# Patient Record
Sex: Female | Born: 2000 | Race: Black or African American | Hispanic: No | Marital: Single | Smoking: Never smoker
Health system: Southern US, Community
[De-identification: ages and names within clinical notes are randomized; demographics above are authoritative.]

## PROBLEM LIST (undated history)

## (undated) DIAGNOSIS — T1490XA Injury, unspecified, initial encounter: Secondary | ICD-10-CM

## (undated) DIAGNOSIS — J45909 Unspecified asthma, uncomplicated: Secondary | ICD-10-CM

## (undated) HISTORY — DX: Injury, unspecified, initial encounter: T14.90XA

---
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
## 2020-01-26 ENCOUNTER — Other Ambulatory Visit: Payer: Medicaid Other

## 2020-03-12 ENCOUNTER — Ambulatory Visit: Payer: Medicaid Other | Admitting: Dermatology

## 2020-03-21 ENCOUNTER — Ambulatory Visit (INDEPENDENT_AMBULATORY_CARE_PROVIDER_SITE_OTHER): Payer: Medicaid Other | Admitting: Pediatrics

## 2020-03-21 ENCOUNTER — Other Ambulatory Visit: Payer: Self-pay

## 2020-03-21 DIAGNOSIS — Z3042 Encounter for surveillance of injectable contraceptive: Secondary | ICD-10-CM | POA: Diagnosis not present

## 2020-03-21 LAB — POCT URINE PREGNANCY: Preg Test, Ur: NEGATIVE

## 2020-03-21 MED ORDER — MEDROXYPROGESTERONE ACETATE 150 MG/ML IM SUSP
150.0000 mg | Freq: Once | INTRAMUSCULAR | Status: AC
  Administered 2020-03-21: 150 mg via INTRAMUSCULAR

## 2020-05-06 IMAGING — DX DG ANKLE COMPLETE 3+V*R*
3 series · 3 of 3 positions shown · non-contrast
Comparison: None.

CLINICAL DATA: Recent fall with pain

EXAM:
RIGHT ANKLE - COMPLETE 3+ VIEW

[ankle ap]
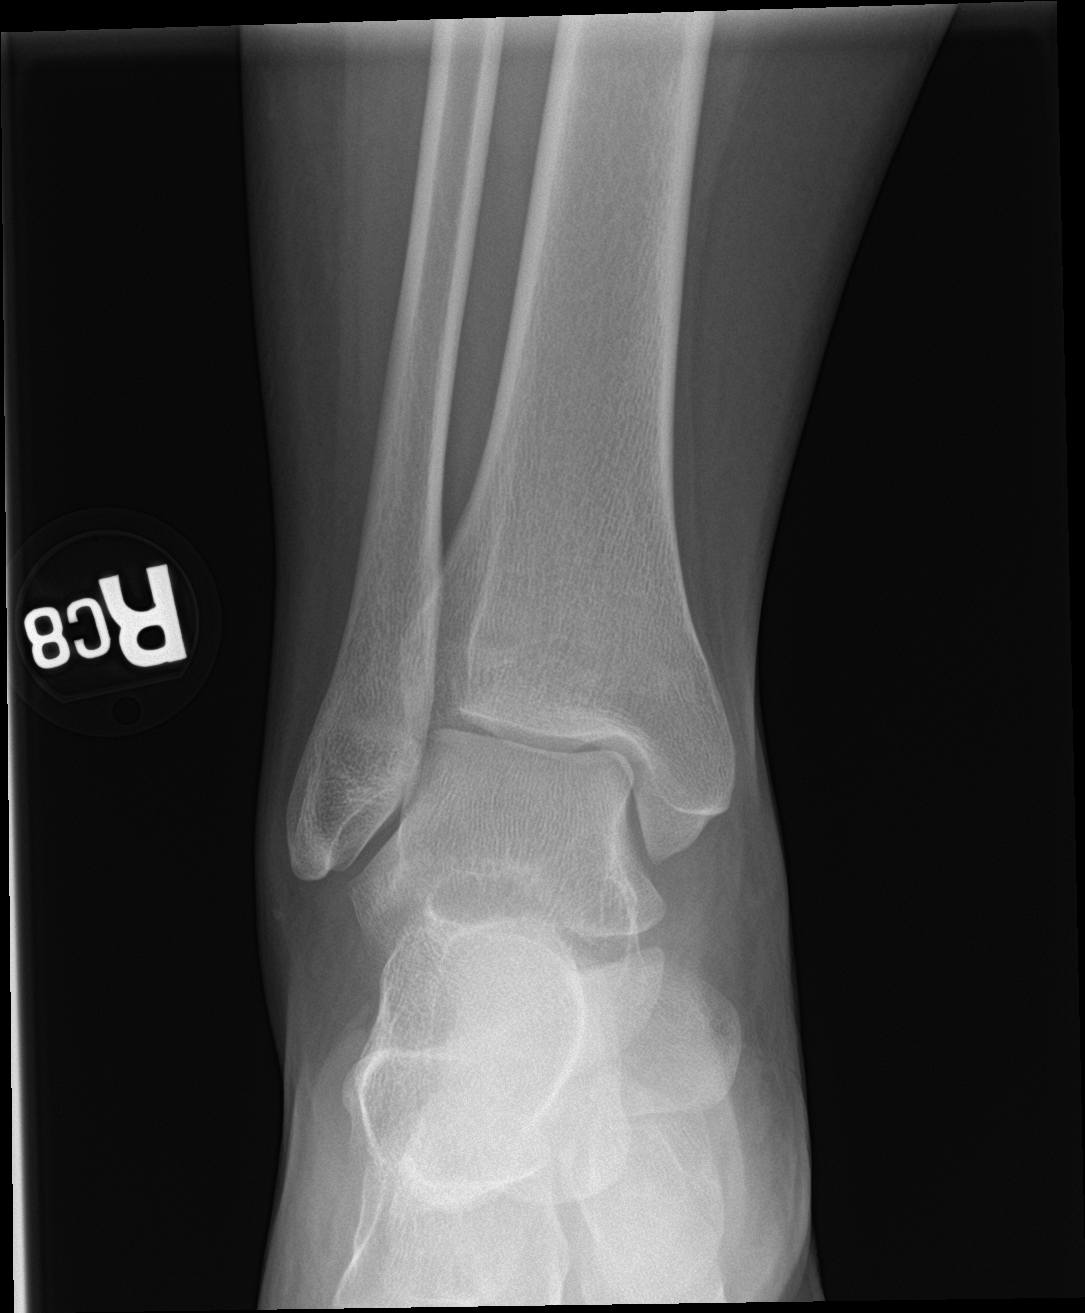

[ankle obl]
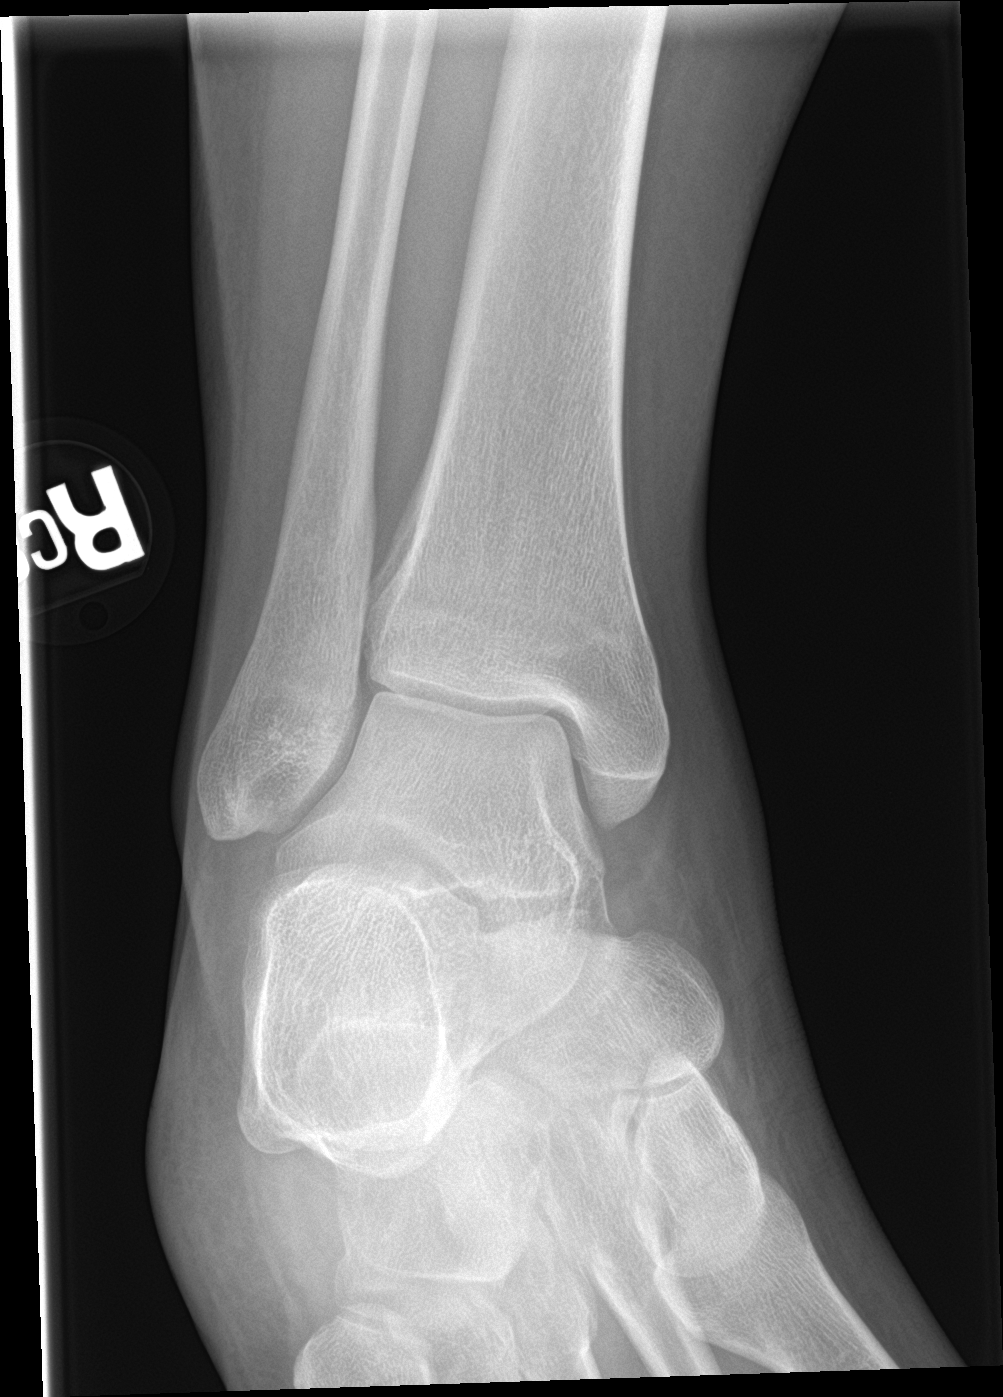

[ankle lat]
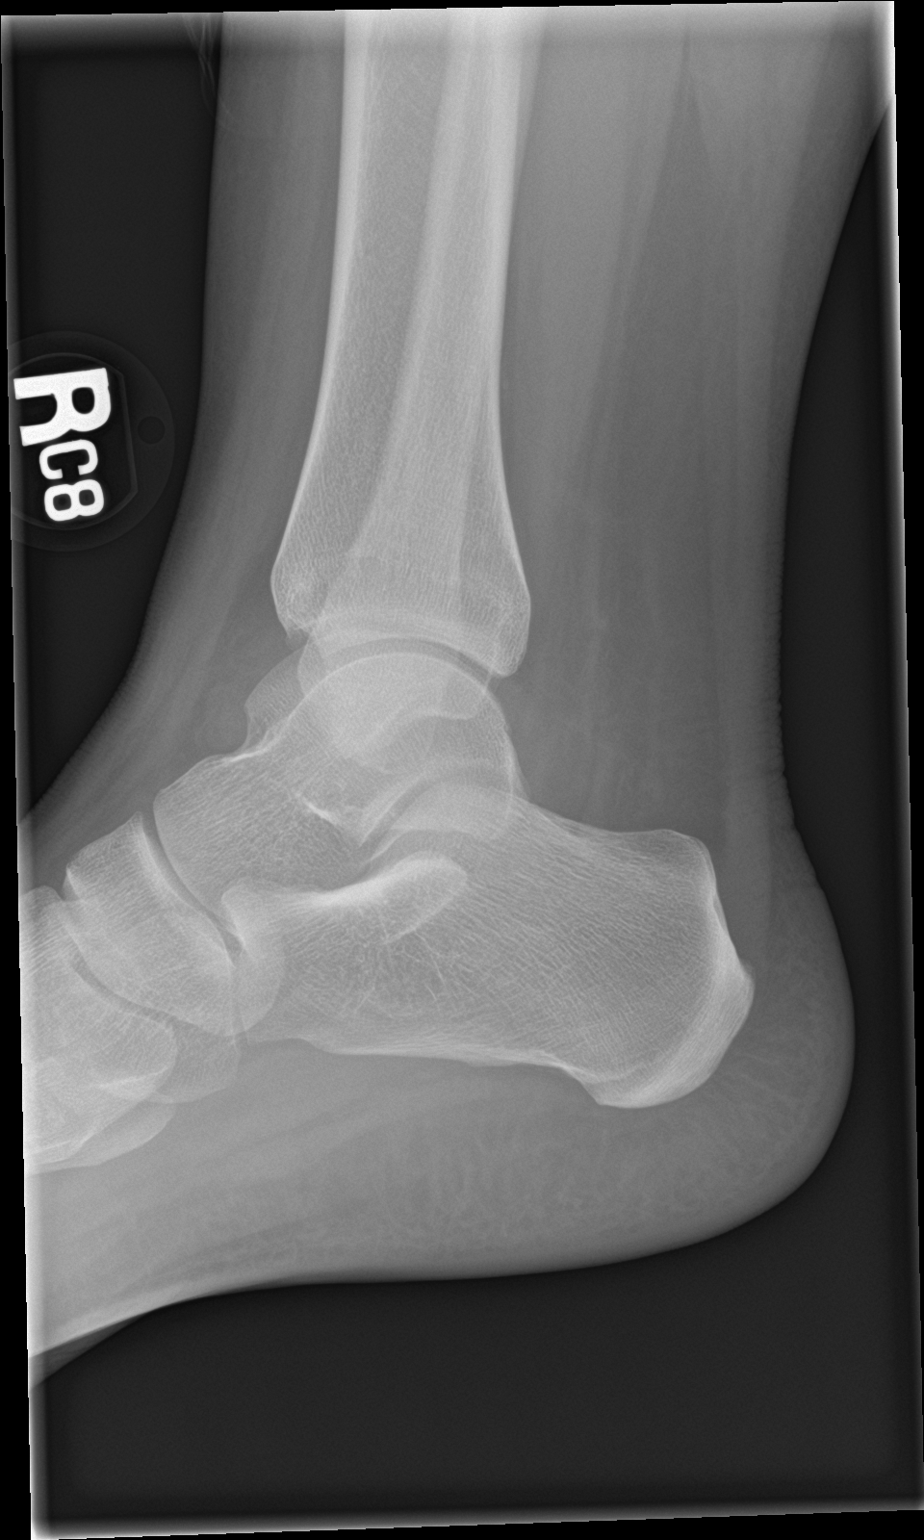

[3 of 3 positions shown; findings below may reference images not displayed]

FINDINGS: Frontal, oblique, and lateral views were obtained. There is mild
soft tissue swelling with small joint effusion. There is no
appreciable fracture. There is no joint space narrowing or erosion.
The ankle mortise appears intact.
IMPRESSION: Mild soft tissue swelling with small joint effusion. Question
underlying ligamentous injury. No fracture evident. No appreciable
arthropathy. Ankle mortis appears intact.

## 2020-05-08 ENCOUNTER — Ambulatory Visit: Payer: Medicaid Other

## 2020-05-08 ENCOUNTER — Encounter: Payer: Self-pay | Admitting: Pediatrics

## 2020-06-18 ENCOUNTER — Other Ambulatory Visit: Payer: Self-pay

## 2020-06-18 ENCOUNTER — Ambulatory Visit (INDEPENDENT_AMBULATORY_CARE_PROVIDER_SITE_OTHER): Payer: Medicaid Other | Admitting: Pediatrics

## 2020-06-18 DIAGNOSIS — Z308 Encounter for other contraceptive management: Secondary | ICD-10-CM

## 2020-06-18 LAB — POCT URINE PREGNANCY: Preg Test, Ur: NEGATIVE

## 2020-06-18 MED ORDER — MEDROXYPROGESTERONE ACETATE 150 MG/ML IM SUSP
150.0000 mg | Freq: Once | INTRAMUSCULAR | Status: AC
  Administered 2020-06-18: 150 mg via INTRAMUSCULAR

## 2020-06-18 NOTE — Progress Notes (Signed)
Pt seen today for birth control administration. Patient is well appearing and no complaints of previous reaction.   Urine HCG completed and negative. Pt given Depo shot in Right Deltoid.   No further needs at this time

## 2020-08-06 ENCOUNTER — Encounter: Payer: Self-pay | Admitting: Pediatrics

## 2020-09-10 ENCOUNTER — Encounter: Payer: Medicaid Other | Admitting: Women's Health

## 2020-09-26 ENCOUNTER — Other Ambulatory Visit: Payer: Self-pay

## 2020-09-26 ENCOUNTER — Other Ambulatory Visit (HOSPITAL_COMMUNITY)
Admission: RE | Admit: 2020-09-26 | Discharge: 2020-09-26 | Disposition: A | Discharge status: Home / Self Care | Payer: Medicaid Other | Source: Ambulatory Visit | Attending: Adult Health | Admitting: Adult Health

## 2020-09-26 ENCOUNTER — Ambulatory Visit (INDEPENDENT_AMBULATORY_CARE_PROVIDER_SITE_OTHER): Payer: Medicaid Other | Admitting: Adult Health

## 2020-09-26 ENCOUNTER — Encounter: Payer: Self-pay | Admitting: Adult Health

## 2020-09-26 VITALS — BP 110/76 | HR 91 | Ht 61.5 in | Wt 207.5 lb

## 2020-09-26 DIAGNOSIS — Z30013 Encounter for initial prescription of injectable contraceptive: Secondary | ICD-10-CM

## 2020-09-26 DIAGNOSIS — Z113 Encounter for screening for infections with a predominantly sexual mode of transmission: Secondary | ICD-10-CM | POA: Diagnosis not present

## 2020-09-26 DIAGNOSIS — N898 Other specified noninflammatory disorders of vagina: Secondary | ICD-10-CM | POA: Insufficient documentation

## 2020-09-26 DIAGNOSIS — Z3202 Encounter for pregnancy test, result negative: Secondary | ICD-10-CM

## 2020-09-26 LAB — POCT URINE PREGNANCY: Preg Test, Ur: NEGATIVE

## 2020-09-26 MED ORDER — MEDROXYPROGESTERONE ACETATE 150 MG/ML IM SUSY: PREFILLED_SYRINGE | INTRAMUSCULAR | 4 refills | Status: DC

## 2020-09-26 MED ORDER — MEDROXYPROGESTERONE ACETATE 150 MG/ML IM SUSP
150.0000 mg | Freq: Once | INTRAMUSCULAR | Status: AC
  Administered 2020-09-26: 150 mg via INTRAMUSCULAR
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
## 2020-09-27 LAB — RPR: RPR Ser Ql: NONREACTIVE

## 2020-09-27 LAB — HEPATITIS C ANTIBODY: Hep C Virus Ab: <0.1 s/co ratio (ref 0.0–0.9)

## 2020-09-27 LAB — HIV ANTIBODY (ROUTINE TESTING W REFLEX): HIV Screen 4th Generation wRfx: NONREACTIVE

## 2020-09-28 LAB — CERVICOVAGINAL ANCILLARY ONLY
Bacterial Vaginitis (gardnerella): NEGATIVE
Candida Glabrata: NEGATIVE
Candida Vaginitis: NEGATIVE
Chlamydia: NEGATIVE
Comment: NEGATIVE
Comment: NEGATIVE
Comment: NEGATIVE
Comment: NEGATIVE
Comment: NEGATIVE
Comment: NORMAL
Neisseria Gonorrhea: NEGATIVE
Trichomonas: NEGATIVE

## 2020-10-16 ENCOUNTER — Emergency Department (HOSPITAL_COMMUNITY)
Admission: EM | Admit: 2020-10-16 | Discharge: 2020-10-16 | Disposition: A | Discharge status: Home / Self Care | Payer: Medicaid Other | Attending: Emergency Medicine | Admitting: Emergency Medicine

## 2020-10-16 ENCOUNTER — Other Ambulatory Visit: Payer: Self-pay

## 2020-10-16 ENCOUNTER — Encounter (HOSPITAL_COMMUNITY): Payer: Self-pay | Admitting: *Deleted

## 2020-10-16 DIAGNOSIS — R112 Nausea with vomiting, unspecified: Secondary | ICD-10-CM | POA: Diagnosis not present

## 2020-10-16 DIAGNOSIS — R110 Nausea: Secondary | ICD-10-CM

## 2020-10-16 DIAGNOSIS — J4530 Mild persistent asthma, uncomplicated: Secondary | ICD-10-CM | POA: Diagnosis not present

## 2020-10-16 DIAGNOSIS — Z7951 Long term (current) use of inhaled steroids: Secondary | ICD-10-CM | POA: Diagnosis not present

## 2020-10-16 DIAGNOSIS — R519 Headache, unspecified: Secondary | ICD-10-CM | POA: Diagnosis not present

## 2020-10-16 LAB — CBC
HCT: 40.6 % (ref 36.0–46.0)
Hemoglobin: 13.5 g/dL (ref 12.0–15.0)
MCH: 29.4 pg (ref 26.0–34.0)
MCHC: 33.3 g/dL (ref 30.0–36.0)
MCV: 88.5 fL (ref 80.0–100.0)
Platelets: 356 10*3/uL (ref 150–400)
RBC: 4.59 MIL/uL (ref 3.87–5.11)
RDW: 12.4 % (ref 11.5–15.5)
WBC: 8.3 10*3/uL (ref 4.0–10.5)
nRBC: 0 % (ref 0.0–0.2)

## 2020-10-16 LAB — COMPREHENSIVE METABOLIC PANEL
ALT: 14 U/L (ref 0–44)
AST: 21 U/L (ref 15–41)
Albumin: 4.3 g/dL (ref 3.5–5.0)
Alkaline Phosphatase: 103 U/L (ref 38–126)
Anion gap: 6 (ref 5–15)
BUN: 8 mg/dL (ref 6–20)
CO2: 22 mmol/L (ref 22–32)
Calcium: 8.8 mg/dL — ABNORMAL LOW (ref 8.9–10.3)
Chloride: 108 mmol/L (ref 98–111)
Creatinine, Ser: 0.62 mg/dL (ref 0.44–1.00)
GFR, Estimated: >60 mL/min (ref >60)
Glucose, Bld: 92 mg/dL (ref 70–99)
Potassium: 3.8 mmol/L (ref 3.5–5.1)
Sodium: 136 mmol/L (ref 135–145)
Total Bilirubin: 0.6 mg/dL (ref 0.3–1.2)
Total Protein: 7.9 g/dL (ref 6.5–8.1)

## 2020-10-16 LAB — LIPASE, BLOOD: Lipase: 24 U/L (ref 11–51)

## 2020-10-16 MED ORDER — ONDANSETRON 4 MG PO TBDP
4.0000 mg | ORAL_TABLET | Freq: Once | ORAL | Status: AC
  Administered 2020-10-16: 4 mg via ORAL

## 2020-10-16 NOTE — ED Provider Notes (Signed)
[facility] EMERGENCY DEPARTMENT Provider Note   CSN: [id number] Arrival date & time: 10/16/20  1542     History Chief Complaint  Patient presents with   Nausea    Allison Key is a 20 y.o. female.  HPI Patient presents with headache and nausea.  Began earlier today but is reportedly having headaches at work.  States she would have headaches before she started a new job but has become worse now.  States that she had a headache and then vomited.  States that the vomiting is still there but the headache is improved.  Denies possibility pregnancy.  Denies abdominal pain.  Denies dysuria.  States the light did bother her a little bit previously but now resolved.  States that the headache was dull and behind both of her eyes.    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 No pertinent surgical history.   OB History     Gravida  0   Para  0   Term  0   Preterm  0   AB  0   Living  0      SAB  0   IAB  0   Ectopic  0   Multiple  0   Live Births  0           Family History  Problem Relation Age of Onset   Diabetes Paternal Grandfather    Alzheimer's disease Paternal Grandmother    Hypertension Maternal Grandmother    Learning disabilities Maternal Grandmother     Kidney disease Maternal Grandmother    Kidney failure Maternal Grandmother    Hypertension Father    Allergies Mother    Hypertension Mother    Healthy Brother    Healthy Brother    Bipolar disorder Sister    Diabetes Other    Hypertension Other     Social History   Tobacco Use   Smoking status: Never    Passive exposure: Yes   Smokeless tobacco: Never  Vaping Use   Vaping Use: Former  Substance Use Topics   Alcohol use: No   Drug use: Yes    Types: Marijuana    Comment: daily    Home Medications Prior to Admission medications   Medication Sig Start Date End Date Taking? Authorizing Provider  acetaminophen (TYLENOL) 500 MG tablet Take 1,000 mg by mouth every 6 (six) hours as needed.   Yes [provider]  clindamycin-benzoyl peroxide (BENZACLIN WITH PUMP) gel Dispense generic brand. Apply to acne on face twice a day after washing with acne soap 03/06/17  Yes Rosiland Oz, MD  DIFFERIN 0.1 % cream Dispense Brand Name. Apply to acne at night after washing face with acne soap 07/21/16  Yes Rosiland Oz, MD  fluticasone (FLOVENT HFA) 110 MCG/ACT inhaler One puff twice  a day for asthma 10/26/19  Yes Rosiland Oz, MD  medroxyPROGESTERone Acetate 150 MG/ML SUSY 150 mg For office injection every 12 weeks 09/26/20  Yes Adline Potter, NP  PROAIR HFA 108 [phone] Base) MCG/ACT inhaler Two puffs every 4 to 6 hours as needed for wheezing or coughing 10/26/19  Yes Rosiland Oz, MD  fluticasone (FLONASE) 50 MCG/ACT nasal spray INHALE 2 SPRAYS IN EACH NOSTRIL ONCE DAILY. Patient not taking: No sig reported 11/20/16   Rosiland Oz, MD    Allergies    Patient has no known allergies.  Review of Systems   Review of Systems  Constitutional:  Positive for appetite change.  HENT:  Negative for congestion.   Eyes:  Positive for photophobia.  Respiratory:  Negative for cough.   Cardiovascular:  Negative for chest pain.  Gastrointestinal:  Positive for  nausea and vomiting.  Genitourinary:  Negative for flank pain.  Musculoskeletal:  Negative for back pain.  Skin:  Negative for rash.  Neurological:  Positive for headaches.  Psychiatric/Behavioral:  Negative for confusion.    Physical Exam Updated Vital Signs BP (!) 122/92   Pulse 80   Temp 98 F (36.7 C)   Resp 14   Ht 5\' 1"  (1.549 m)   Wt 91.5 kg   SpO2 100%   BMI 38.11 kg/m   Physical Exam Vitals and nursing note reviewed.  Constitutional:      Appearance: Normal appearance.  HENT:     Head: Atraumatic.  Eyes:     Extraocular Movements: Extraocular movements intact.     Pupils: Pupils are equal, round, and reactive to light.  Cardiovascular:     Rate and Rhythm: Regular rhythm.  Pulmonary:     Breath sounds: No wheezing or rhonchi.  Abdominal:     Tenderness: There is no abdominal tenderness.  Musculoskeletal:        General: No tenderness.     Cervical back: Neck supple.  Skin:    General: Skin is warm.     Capillary Refill: Capillary refill takes less than 2 seconds.  Neurological:     Mental Status: She is alert and oriented to person, place, and time.  Psychiatric:        Mood and Affect: Mood normal.    ED Results / Procedures / Treatments   Labs (all labs ordered are listed, but only abnormal results are displayed) Labs Reviewed  COMPREHENSIVE METABOLIC PANEL - Abnormal; Notable for the following components:      Result Value   Calcium 8.8 (*)    All other components within normal limits  LIPASE, BLOOD  CBC  URINALYSIS, ROUTINE W REFLEX MICROSCOPIC  POC URINE PREG, ED    EKG None  Radiology No results found.  Procedures Procedures   Medications Ordered in ED Medications  ondansetron (ZOFRAN-ODT) disintegrating tablet 4 mg (4 mg Oral Given 10/16/20 2101)    ED Course  I have reviewed the triage vital signs and the nursing notes.  Pertinent labs & imaging results that were available during my care of the patient were reviewed by me  and considered in my medical decision making (see chart for details).    MDM Rules/Calculators/A&P                           Patient with headache and nausea.  Headache is resolved.  Nonfocal exam now except for some nausea.  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  Has had headaches before this but does wear safety glasses now which she did not do previously.  Lab work reassuring.  Tolerated orals.  Requesting work note.  Given antiemetics.  Doubt severe intracranial pathology at this time.  Doubt pregnancy.  Doubt intra-abdominal pathology.  Discharge home. Final Clinical Impression(s) / ED Diagnoses Final diagnoses:  Nausea  Acute nonintractable headache, unspecified headache type    Rx / DC Orders ED Discharge Orders     None        Benjiman Core, MD 10/16/20 2336

## 2020-10-16 NOTE — ED Notes (Addendum)
Nausea relieved, requests 2 day work note. Pt able to tolerate ice water without nausea or vomiting. Dr. Rubin Payor notified.

## 2020-10-16 NOTE — ED Notes (Signed)
Pt given ice water as fluid challenge, will reevaluate in 15 minutes.

## 2020-10-16 NOTE — ED Triage Notes (Signed)
C/o headache with nausea

## 2020-10-17 ENCOUNTER — Telehealth: Payer: Self-pay

## 2020-10-17 NOTE — Telephone Encounter (Signed)
Transition Care Management Unsuccessful Follow-up Telephone Call  Date of discharge and from where:  10/16/2020-[facility]   Attempts:  1st Attempt  Reason for unsuccessful TCM follow-up call:  Left voice message

## 2020-10-22 NOTE — Telephone Encounter (Signed)
Transition Care Management Unsuccessful Follow-up Telephone Call  Date of discharge and from where:  10/16/2020 from [facility]  Attempts:  2nd Attempt  Reason for unsuccessful TCM follow-up call:  Left voice message

## 2020-10-24 NOTE — Telephone Encounter (Signed)
Transition Care Management Unsuccessful Follow-up Telephone Call  Date of discharge and from where:  10/16/2020-[facility]  Attempts:  3rd Attempt  Reason for unsuccessful TCM follow-up call:  Left voice message

## 2020-10-30 ENCOUNTER — Ambulatory Visit: Payer: Self-pay | Admitting: Pediatrics

## 2020-11-16 ENCOUNTER — Other Ambulatory Visit: Payer: Self-pay | Admitting: Pediatrics

## 2020-11-16 DIAGNOSIS — L700 Acne vulgaris: Secondary | ICD-10-CM

## 2020-11-19 NOTE — Telephone Encounter (Signed)
Needs a refill

## 2020-11-19 NOTE — Telephone Encounter (Signed)
Ok thank you 

## 2020-12-18 ENCOUNTER — Other Ambulatory Visit: Payer: Self-pay

## 2020-12-18 ENCOUNTER — Ambulatory Visit (INDEPENDENT_AMBULATORY_CARE_PROVIDER_SITE_OTHER): Payer: Medicaid Other

## 2020-12-18 DIAGNOSIS — Z3042 Encounter for surveillance of injectable contraceptive: Secondary | ICD-10-CM

## 2020-12-18 MED ORDER — MEDROXYPROGESTERONE ACETATE 150 MG/ML IM SUSY: PREFILLED_SYRINGE | Freq: Once | INTRAMUSCULAR | Status: AC

## 2020-12-18 NOTE — Progress Notes (Signed)
   NURSE VISIT- INJECTION  SUBJECTIVE:  Allison Key is a 20 y.o. G0P0000 female here for a Depo Provera for contraception/period management. She is a GYN patient.   OBJECTIVE:  There were no vitals taken for this visit.  Appears well, in no apparent distress  Injection administered in: Left deltoid  Meds ordered this encounter  Medications   medroxyPROGESTERone Acetate SUSY    ASSESSMENT: GYN patient Depo Provera for contraception/period management PLAN: Follow-up: in 11-13 weeks for next Depo   Allison Key A Allison Key  12/18/2020 3:44 PM

## 2020-12-19 ENCOUNTER — Ambulatory Visit (INDEPENDENT_AMBULATORY_CARE_PROVIDER_SITE_OTHER): Payer: Medicaid Other | Admitting: Adult Health

## 2020-12-19 ENCOUNTER — Encounter: Payer: Self-pay | Admitting: Adult Health

## 2020-12-19 VITALS — BP 126/86 | HR 97 | Ht 61.0 in | Wt 205.8 lb

## 2020-12-19 DIAGNOSIS — L732 Hidradenitis suppurativa: Secondary | ICD-10-CM | POA: Diagnosis not present

## 2020-12-19 MED ORDER — DOXYCYCLINE HYCLATE 100 MG PO TABS: 100.0000 mg | ORAL_TABLET | Freq: Two times a day (BID) | ORAL | 1 refills | Status: DC

## 2020-12-19 MED ORDER — CLINDAMYCIN PHOSPHATE 1 % EX GEL: Freq: Two times a day (BID) | CUTANEOUS | 2 refills | Status: DC

## 2020-12-19 NOTE — Progress Notes (Signed)
  Subjective:     Patient ID: Allison Key, female   DOB: [birth year], 20 y.o.   MRN: [id number]  HPI Allison Key is a 20 year old black female,single, G0P0, in complaining of boil left under arm, and scarring.   Review of Systems Has ?boil left arm and scarring Reviewed past medical,surgical, social and family history. Reviewed medications and allergies.     Objective:   Physical Exam BP 126/86 (BP Location: Right Arm, Patient Position: Sitting, Cuff Size: Normal)   Pulse 97   Ht 5\' 1"  (1.549 m)   Wt 205 lb 12.8 oz (93.4 kg)   BMI 38.89 kg/m     Skin warm and dry. Neck: mid line trachea, normal thyroid, good ROM, no lymphadenopathy noted. Lungs: clear to ausculation bilaterally. Cardiovascular: regular rate and rhythm. Has 1 cm thickened area left under arm and scarring. Co exam with NP student.  Fall risk is low Depression screen [facility] 2/9 12/19/2020 09/26/2020 10/26/2019  Decreased Interest 0 0 2  Down, Depressed, Hopeless 0 0 2  PHQ - 2 Score 0 0 4  Altered sleeping - 0 2  Tired, decreased energy - 3 1  Change in appetite - 2 0  Feeling bad or failure about yourself  - 0 0  Trouble concentrating - 0 1  Moving slowly or fidgety/restless - 0 1  Suicidal thoughts - 0 -  PHQ-9 Score - 5 9     Upstream - 12/19/20 0850       Pregnancy Intention Screening   Does the patient want to become pregnant in the next year? No    Does the patient's partner want to become pregnant in the next year? No    Would the patient like to discuss contraceptive options today? No      Contraception Wrap Up   Current Method Hormonal Injection    End Method Hormonal Injection    Contraception Counseling Provided No             Assessment:     1. Hidradenitis suppurativa of left axilla Will rx doxycycline and cleocin T gel Meds ordered this encounter  Medications   doxycycline (VIBRA-TABS) 100 MG tablet    Sig: Take 1 tablet (100 mg total) by mouth 2 (two) times daily.     Dispense:  20 tablet    Refill:  1    Order Specific Question:   Supervising Provider    Answer:   12/21/20 H [[id number]]   clindamycin (CLEOCIN-T) 1 % gel    Sig: Apply topically 2 (two) times daily.    Dispense:  30 g    Refill:  2    Order Specific Question:   Supervising Provider    Answer:   Duane Lope H [[id number]]   Stop solid deodorant and use clear gel Do not shave If persists will add metformin Review handout on HS    Plan:     Follow up prn

## 2021-02-26 ENCOUNTER — Other Ambulatory Visit: Payer: Medicaid Other

## 2021-02-27 ENCOUNTER — Other Ambulatory Visit: Payer: Self-pay

## 2021-02-27 ENCOUNTER — Telehealth: Payer: Self-pay | Admitting: *Deleted

## 2021-02-27 ENCOUNTER — Other Ambulatory Visit (INDEPENDENT_AMBULATORY_CARE_PROVIDER_SITE_OTHER): Payer: Medicaid Other | Admitting: *Deleted

## 2021-02-27 ENCOUNTER — Other Ambulatory Visit (HOSPITAL_COMMUNITY)
Admission: RE | Admit: 2021-02-27 | Discharge: 2021-02-27 | Disposition: A | Discharge status: Home / Self Care | Payer: Medicaid Other | Source: Ambulatory Visit | Attending: Obstetrics & Gynecology | Admitting: Obstetrics & Gynecology

## 2021-02-27 DIAGNOSIS — Z113 Encounter for screening for infections with a predominantly sexual mode of transmission: Secondary | ICD-10-CM | POA: Insufficient documentation

## 2021-02-27 MED ORDER — DOXYCYCLINE HYCLATE 100 MG PO TABS: 100.0000 mg | ORAL_TABLET | Freq: Two times a day (BID) | ORAL | 1 refills | Status: DC

## 2021-02-27 MED ORDER — CLINDAMYCIN PHOSPHATE 1 % EX GEL: Freq: Two times a day (BID) | CUTANEOUS | 2 refills | Status: DC

## 2021-02-27 NOTE — Telephone Encounter (Signed)
Pt never picked up her doxycycline or clindamycin. The rx expired. Can you send it in again to [facility]?

## 2021-02-27 NOTE — Telephone Encounter (Signed)
Sent refill in for doxy and cleocin gel

## 2021-02-27 NOTE — Progress Notes (Signed)
° °  NURSE VISIT- VAGINITIS/STD/POC  SUBJECTIVE:  Allison Key is a 21 y.o. G0P0000 GYN patientfemale here for a vaginal swab for STD screen.  She reports the following symptoms: none for 0 days. Denies abnormal vaginal bleeding, significant pelvic pain, fever, or UTI symptoms.  OBJECTIVE:  There were no vitals taken for this visit.  Appears well, in no apparent distress  ASSESSMENT: Vaginal swab for STD screen  PLAN: Self-collected vaginal probe for Gonorrhea, Chlamydia, Trichomonas, Bacterial Vaginosis, Yeast sent to lab Treatment: to be determined once results are received Follow-up as needed if symptoms persist/worsen, or new symptoms develop  [name]  02/27/2021 10:08 AM

## 2021-02-28 LAB — CERVICOVAGINAL ANCILLARY ONLY
Bacterial Vaginitis (gardnerella): NEGATIVE
Candida Glabrata: NEGATIVE
Candida Vaginitis: NEGATIVE
Chlamydia: NEGATIVE
Comment: NEGATIVE
Comment: NEGATIVE
Comment: NEGATIVE
Comment: NEGATIVE
Comment: NEGATIVE
Comment: NORMAL
Neisseria Gonorrhea: NEGATIVE
Trichomonas: NEGATIVE

## 2021-03-08 ENCOUNTER — Ambulatory Visit (INDEPENDENT_AMBULATORY_CARE_PROVIDER_SITE_OTHER): Payer: Medicaid Other | Admitting: *Deleted

## 2021-03-08 ENCOUNTER — Other Ambulatory Visit: Payer: Self-pay

## 2021-03-08 DIAGNOSIS — Z3042 Encounter for surveillance of injectable contraceptive: Secondary | ICD-10-CM | POA: Diagnosis not present

## 2021-03-08 MED ORDER — MEDROXYPROGESTERONE ACETATE 150 MG/ML IM SUSP
150.0000 mg | Freq: Once | INTRAMUSCULAR | Status: AC
  Administered 2021-03-08: 150 mg via INTRAMUSCULAR

## 2021-03-08 NOTE — Progress Notes (Signed)
° °  NURSE VISIT- INJECTION  SUBJECTIVE:  Allison Key is a 21 y.o. G0P0000 female here for a Depo Provera for contraception/period management. She is a GYN patient.   OBJECTIVE:  There were no vitals taken for this visit.  Appears well, in no apparent distress  Injection administered in: Right deltoid  Meds ordered this encounter  Medications   medroxyPROGESTERone (DEPO-PROVERA) injection 150 mg    ASSESSMENT: GYN patient Depo Provera for contraception/period management PLAN: Follow-up: in 11-13 weeks for next Depo   Malachy Mood  03/08/2021 11:13 AM

## 2021-04-27 ENCOUNTER — Encounter: Payer: Self-pay | Admitting: Pediatrics

## 2021-04-27 ENCOUNTER — Other Ambulatory Visit: Payer: Self-pay | Admitting: Pediatrics

## 2021-04-27 DIAGNOSIS — J4530 Mild persistent asthma, uncomplicated: Secondary | ICD-10-CM

## 2021-05-30 ENCOUNTER — Encounter: Payer: Self-pay | Admitting: *Deleted

## 2021-05-31 ENCOUNTER — Other Ambulatory Visit: Payer: Self-pay | Admitting: Pediatrics

## 2021-05-31 ENCOUNTER — Ambulatory Visit (INDEPENDENT_AMBULATORY_CARE_PROVIDER_SITE_OTHER): Payer: Medicaid Other | Admitting: *Deleted

## 2021-05-31 DIAGNOSIS — J4530 Mild persistent asthma, uncomplicated: Secondary | ICD-10-CM

## 2021-05-31 DIAGNOSIS — Z3042 Encounter for surveillance of injectable contraceptive: Secondary | ICD-10-CM

## 2021-05-31 MED ORDER — MEDROXYPROGESTERONE ACETATE 150 MG/ML IM SUSP
150.0000 mg | Freq: Once | INTRAMUSCULAR | Status: AC
  Administered 2021-05-31: 150 mg via INTRAMUSCULAR

## 2021-05-31 NOTE — Progress Notes (Signed)
? ?  NURSE VISIT- INJECTION ? ?SUBJECTIVE:  ?Allison Key is a 21 y.o. G0P0000 female here for a Depo Provera for contraception/period management. She is a GYN patient.  ? ?OBJECTIVE:  ?There were no vitals taken for this visit.  ?Appears well, in no apparent distress ? ?Injection administered in: Left deltoid ? ?Meds ordered this encounter  ?Medications  ? medroxyPROGESTERone (DEPO-PROVERA) injection 150 mg  ? ? ?ASSESSMENT: ?GYN patient Depo Provera for contraception/period management ?PLAN: ?Follow-up: in 11-13 weeks for next Depo. Pt is requesting a refill on Proair inhaler. She don't have a PCP.   ? ?Malachy Mood  ?05/31/2021 ?11:08 AM ? ?

## 2021-08-16 ENCOUNTER — Ambulatory Visit: Payer: Medicaid Other

## 2021-08-23 ENCOUNTER — Ambulatory Visit (INDEPENDENT_AMBULATORY_CARE_PROVIDER_SITE_OTHER): Payer: Medicaid Other | Admitting: *Deleted

## 2021-08-23 ENCOUNTER — Encounter: Payer: Self-pay | Admitting: *Deleted

## 2021-08-23 DIAGNOSIS — Z3042 Encounter for surveillance of injectable contraceptive: Secondary | ICD-10-CM | POA: Diagnosis not present

## 2021-08-23 MED ORDER — MEDROXYPROGESTERONE ACETATE 150 MG/ML IM SUSP
150.0000 mg | Freq: Once | INTRAMUSCULAR | Status: AC
  Administered 2021-08-23: 150 mg via INTRAMUSCULAR

## 2021-08-23 NOTE — Progress Notes (Signed)
   NURSE VISIT- INJECTION  SUBJECTIVE:  Allison Key is a 21 y.o. G0P0000 female here for a Depo Provera for contraception/period management. She is a GYN patient.   OBJECTIVE:  There were no vitals taken for this visit.  Appears well, in no apparent distress  Injection administered in: Right deltoid  Meds ordered this encounter  Medications   medroxyPROGESTERone (DEPO-PROVERA) injection 150 mg    ASSESSMENT: GYN patient Depo Provera for contraception/period management PLAN: Follow-up: in 11-13 weeks for next Depo   Jobe Marker  08/23/2021 11:08 AM

## 2021-08-26 ENCOUNTER — Ambulatory Visit
Admission: RE | Admit: 2021-08-26 | Discharge: 2021-08-26 | Disposition: A | Discharge status: Home / Self Care | Payer: Medicaid Other | Source: Ambulatory Visit | Attending: Nurse Practitioner | Admitting: Nurse Practitioner

## 2021-08-26 ENCOUNTER — Ambulatory Visit (INDEPENDENT_AMBULATORY_CARE_PROVIDER_SITE_OTHER): Payer: Medicaid Other

## 2021-08-26 VITALS — BP 116/82 | HR 78 | Temp 98.2°F | Resp 20

## 2021-08-26 DIAGNOSIS — M25561 Pain in right knee: Secondary | ICD-10-CM

## 2021-08-26 MED ORDER — IBUPROFEN 800 MG PO TABS: 800.0000 mg | ORAL_TABLET | Freq: Three times a day (TID) | ORAL | 0 refills | Status: DC | PRN

## 2021-08-26 NOTE — Discharge Instructions (Addendum)
-   Knee x-ray today is negative for broken bones - Please wear the knee immobilizer and use the crutches until you follow up with the Orthopedic provider - You can use ice, ibuprofen 800 mg every 8 hours with food and Tylenol [phone] mg every 6 hours as needed for pain - Call to make an appointment with Orthopedic provider for later this week with no improvement

## 2021-08-26 NOTE — ED Triage Notes (Signed)
Pt presents with right knee pain after hitting on bottom of pool at [employer] coming off of slide, swelling is present

## 2021-08-26 NOTE — ED Provider Notes (Signed)
[facility]    CSN: [id number] Arrival date & time: 08/26/21  1313      History   Chief Complaint Chief Complaint  Patient presents with   Leg Injury    Entered by patient    HPI Allison Key is a 21 y.o. female.   Patient presents with right knee pain after going down a water slide yesterday and hitting her knee on the slide and hard concrete.  She endorses pain all around the knee that is shooting down her leg to her ankle and up to her groin.  She reports pain is worse with weight bearing.  Endorses swelling.  Denies weakness with walking, sensation of giving way, locking, popping, bruising, and numbness or tingling in her toes since the injury.  She has not taken anything for the pain.       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 No pertinent surgical history.  OB History     Gravida  0   Para  0   Term  0   Preterm  0   AB  0   Living  0      SAB  0   IAB  0   Ectopic  0   Multiple  0   Live Births  0            Home Medications    Prior to Admission medications   Medication Sig Start Date End Date Taking? Authorizing Provider  ibuprofen (ADVIL) 800 MG tablet Take 1 tablet (800 mg  total) by mouth every 8 (eight) hours as needed for moderate pain. Take with food to prevent GI upest 08/26/21  Yes Valentino Nose, NP  aspirin-acetaminophen-caffeine (EXCEDRIN MIGRAINE) [id number] MG tablet Take 2 tablets by mouth every 6 (six) hours as needed for headache.    [provider]  clindamycin (CLEOCIN-T) 1 % gel Apply topically 2 (two) times daily. 02/27/21   Adline Potter, NP  clindamycin-benzoyl peroxide (BENZACLIN WITH PUMP) gel Dispense generic brand. Apply to acne on face twice a day after washing with acne soap 03/06/17   Rosiland Oz, MD  DIFFERIN 0.1 % cream Dispense Brand Name. Apply to acne at night after washing face with acne soap 07/21/16   Rosiland Oz, MD  fluticasone [facility]) 50 MCG/ACT nasal spray INHALE 2 SPRAYS IN EACH NOSTRIL ONCE DAILY. 11/20/16   Rosiland Oz, MD  medroxyPROGESTERone Acetate 150 MG/ML SUSY 150 mg For office injection every 12 weeks 09/26/20   Adline Potter, NP  PROAIR HFA 108 [phone] Base) MCG/ACT inhaler Two puffs every 4 to 6 hours as needed for wheezing or coughing 10/26/19   Rosiland Oz, MD  RETIN-A 0.025 % cream SMARTSIG:Sparingly Topical Every Night 11/19/20  [provider]    Family History Family History  Problem Relation Age of Onset   Diabetes Paternal Grandfather    Alzheimer's disease Paternal Grandmother    Hypertension Maternal Grandmother    Learning disabilities Maternal Grandmother    Kidney disease Maternal Grandmother    Kidney failure Maternal Grandmother    Hypertension Father    Allergies Mother    Hypertension Mother    Healthy Brother    Healthy Brother    Bipolar disorder Sister    Diabetes Other    Hypertension Other     Social History Social History   Tobacco Use   Smoking status: Never    Passive exposure: Yes   Smokeless tobacco: Never  Vaping Use   Vaping Use: Former  Substance Use Topics   Alcohol use: No   Drug use: Yes    Types:  Marijuana    Comment: daily     Allergies   Patient has no known allergies.   Review of Systems Review of Systems Per HPI  Physical Exam Triage Vital Signs ED Triage Vitals  Enc Vitals Group     BP 08/26/21 1323 116/82     Pulse Rate 08/26/21 1323 78     Resp 08/26/21 1323 20     Temp 08/26/21 1323 98.2 F (36.8 C)     Temp src --      SpO2 08/26/21 1323 98 %     Weight --      Height --      Head Circumference --      Peak Flow --      Pain Score 08/26/21 1322 8     Pain Loc --      Pain Edu? --      Excl. in GC? --    No data found.  Updated Vital Signs BP 116/82   Pulse 78   Temp 98.2 F (36.8 C)   Resp 20   SpO2 98%   Visual Acuity Right Eye Distance:   Left Eye Distance:   Bilateral Distance:    Right Eye Near:   Left Eye Near:    Bilateral Near:     Physical Exam Vitals and nursing note reviewed.  Constitutional:      General: She is not in acute distress.    Appearance: Normal appearance. She is obese. She is not toxic-appearing.  HENT:     Head: Normocephalic and atraumatic.  Pulmonary:     Effort: Pulmonary effort is normal. No respiratory distress.  Musculoskeletal:        General: Tenderness present.     Right knee: Effusion and bony tenderness present. No swelling, erythema, ecchymosis or crepitus. Tenderness present over the lateral joint line. Normal alignment. Normal pulse.     Left knee: Normal.     Right lower leg: No edema.     Left lower leg: No edema.     Right ankle: Normal. Normal range of motion. Normal pulse.     Left ankle: Normal.     Right foot: Normal. Normal capillary refill. Normal pulse.     Left foot: Normal.     Comments: Inspection: mild swelling to the right knee diffusely; no obvious deformity or redness Palpation: right knee tender to palpation along the lateral joint line; no obvious deformity palpated and laxity testing difficult to perform secondary to pain ROM: Full ROM to right knee, ankle,  foot Strength: 5/5 right lower extremity Neurovascular: neurovascularly intact in right lower extremity  Skin:    General: Skin is warm and dry.     Capillary Refill: Capillary refill takes less than 2 seconds.     Coloration: Skin is not jaundiced or pale.     Findings: No erythema.  Neurological:     Mental Status: She is alert and oriented to person, place, and time.  Psychiatric:        Behavior: Behavior is cooperative.      UC Treatments / Results  Labs (all labs ordered are listed, but only abnormal results are displayed) Labs Reviewed - No data to display  EKG   Radiology DG Knee Complete 4 Views Right  Result Date: 08/26/2021 CLINICAL DATA:  Anterior knee pain EXAM: RIGHT KNEE - COMPLETE 4+ VIEW COMPARISON:  None Available. FINDINGS: No acute fracture or dislocation.The joint spaces are preserved.Alignment is unremarkable.No significant soft tissue abnormality or foreign body. IMPRESSION: No acute osseous abnormality. Electronically Signed   By: Wiliam Ke M.D.   On: 08/26/2021 13:38    Procedures Procedures (including critical care time)  Medications Ordered in UC Medications - No data to display  Initial Impression / Assessment and Plan / UC Course  I have reviewed the triage vital signs and the nursing notes.  Pertinent labs & imaging results that were available during my care of the patient were reviewed by me and considered in my medical decision making (see chart for details).    Patient is a very pleasant, well-appearing 21 year old female presenting for right knee pain today.  X-ray imaging does not show any acute bony abnormality.  On examination, laxity testing is difficult to perform, however there is tenderness along the lateral joint line.  Will place in a knee immobilizer, give crutches, use Ace wrap for compression, remain nonweightbearing for the next couple of days.  Tylenol/ibuprofen to be used as needed for pain.  Note given for work.   Encouraged close follow-up with orthopedic provider with no improvement with this treatment; contact information given. The patient was given the opportunity to ask questions.  All questions answered to their satisfaction.  The patient is in agreement to this plan.   Final Clinical Impressions(s) / UC Diagnoses   Final diagnoses:  Acute pain of right knee     Discharge Instructions      - Knee x-ray today is negative for broken bones - Please wear the knee immobilizer and use the crutches until you follow up with the Orthopedic provider - You can use ice, ibuprofen 800 mg every 8 hours with food and Tylenol [phone] mg every 6 hours as needed for pain - Call to make an appointment with Orthopedic provider for later this week with no improvement    ED Prescriptions     Medication Sig Dispense Auth. Provider   ibuprofen (ADVIL) 800 MG tablet Take 1 tablet (800 mg total) by mouth every 8 (eight) hours as needed for moderate pain. Take with food to prevent GI upest 21 tablet Valentino Nose, NP      PDMP not reviewed this encounter.   Valentino Nose, NP 08/26/21 1447

## 2021-08-29 ENCOUNTER — Ambulatory Visit (INDEPENDENT_AMBULATORY_CARE_PROVIDER_SITE_OTHER): Payer: Medicaid Other | Admitting: Orthopaedic Surgery

## 2021-08-29 ENCOUNTER — Telehealth: Payer: Self-pay | Admitting: Orthopaedic Surgery

## 2021-08-29 ENCOUNTER — Encounter: Payer: Self-pay | Admitting: Orthopaedic Surgery

## 2021-08-29 VITALS — BP 140/107 | HR 105 | Ht 61.0 in | Wt 213.0 lb

## 2021-08-29 DIAGNOSIS — S838X1A Sprain of other specified parts of right knee, initial encounter: Secondary | ICD-10-CM | POA: Diagnosis not present

## 2021-08-29 DIAGNOSIS — M25561 Pain in right knee: Secondary | ICD-10-CM

## 2021-08-29 MED ORDER — NAPROXEN 500 MG PO TABS: 500.0000 mg | ORAL_TABLET | Freq: Two times a day (BID) | ORAL | 5 refills | Status: DC

## 2021-08-29 MED ORDER — HYDROCODONE-ACETAMINOPHEN 5-325 MG PO TABS: ORAL_TABLET | ORAL | 0 refills | Status: DC
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
## 2021-08-29 NOTE — Patient Instructions (Addendum)
While we are working on your approval please go ahead and call to schedule your appointment with [facility] in at least 2 weeks.    Central Scheduling [phone]   If you can't get an appt for your imaging before your follow up, please call back and reschedule your follow up to review that imaging 3-4 days after you have that imaging performed.   OOW note until after MRI review

## 2021-08-29 NOTE — Telephone Encounter (Signed)
Note issued for work - light duty if available; per patient request / out of work otherwise until MRI review per office visit with Dr Hilda Lias today.

## 2021-09-08 ENCOUNTER — Encounter (HOSPITAL_COMMUNITY): Payer: Self-pay

## 2021-09-08 ENCOUNTER — Emergency Department (HOSPITAL_COMMUNITY)
Admission: EM | Admit: 2021-09-08 | Discharge: 2021-09-08 | Disposition: A | Discharge status: Home / Self Care | Payer: Medicaid Other | Attending: Emergency Medicine | Admitting: Emergency Medicine

## 2021-09-08 ENCOUNTER — Other Ambulatory Visit: Payer: Self-pay

## 2021-09-08 DIAGNOSIS — M25561 Pain in right knee: Secondary | ICD-10-CM | POA: Diagnosis not present

## 2021-09-08 DIAGNOSIS — Y9319 Activity, other involving water and watercraft: Secondary | ICD-10-CM | POA: Diagnosis not present

## 2021-09-08 DIAGNOSIS — X501XXA Overexertion from prolonged static or awkward postures, initial encounter: Secondary | ICD-10-CM | POA: Insufficient documentation

## 2021-09-08 DIAGNOSIS — Y92831 Amusement park as the place of occurrence of the external cause: Secondary | ICD-10-CM | POA: Insufficient documentation

## 2021-09-08 DIAGNOSIS — R600 Localized edema: Secondary | ICD-10-CM | POA: Insufficient documentation

## 2021-09-08 MED ORDER — NAPROXEN 500 MG PO TABS: 500.0000 mg | ORAL_TABLET | Freq: Two times a day (BID) | ORAL | 0 refills | Status: DC

## 2021-09-08 NOTE — ED Triage Notes (Signed)
Pt to er, pt states that she [place] [employer] [place], states that she was seen at urgent care and they gave her a braced and did an x ray, states that it wasn't broken, pt states that she has followed up with ortho and has an mri scheduled for Tuesday because she is still having pain, states that she is here because her upper leg is swollen, pt c/o R leg swelling

## 2021-09-08 NOTE — ED Provider Notes (Signed)
[facility] EMERGENCY DEPARTMENT Provider Note   CSN: [id number] Arrival date & time: 09/08/21  1226     History  Chief Complaint  Patient presents with   Leg Swelling    Allison Key is a 21 y.o. female.  HPI      Allison Key is a 21 y.o. female who presents to the Emergency Department complaining of persistent right knee pain.  She sustained injury of her right [place]  She describes having a direct blow to the anterior knee and possible twisting of the knee.  She was seen at urgent care had x-ray of the knee and a knee brace was applied.  She states the brace did not fit correctly and caused some swelling of her leg.  She followed up with local orthopedics and has a MRI scheduled for 09/10/2021.  She came to the ER today for concern of swelling to her anterior knee and continued pain.  She denies any numbness or weakness of her leg.  She denies calf pain, redness or swelling.  She was taking naproxen with some relief, but states she has ran out of the medication.    Home Medications Prior to Admission medications   Medication Sig Start Date End Date Taking? Authorizing Provider  aspirin-acetaminophen-caffeine (EXCEDRIN MIGRAINE) [phone] MG tablet Take 2 tablets by mouth every 6 (six) hours as needed for headache.    [provider]  clindamycin (CLEOCIN-T) 1 % gel Apply topically 2 (two) times daily. 02/27/21   Adline Potter, NP  clindamycin-benzoyl peroxide (BENZACLIN WITH PUMP) gel Dispense generic brand. Apply to acne on face twice a day after washing with acne soap 03/06/17   Rosiland Oz, MD  DIFFERIN 0.1 % cream Dispense Brand Name. Apply to acne at night after washing face with acne soap Patient not taking: Reported on 08/29/2021 07/21/16   Rosiland Oz, MD  fluticasone [facility]) 50 MCG/ACT nasal spray INHALE 2 SPRAYS IN EACH NOSTRIL ONCE DAILY. 11/20/16   Rosiland Oz, MD   HYDROcodone-acetaminophen (NORCO/VICODIN) 5-325 MG tablet One tablet every four hours for pain. 08/29/21   Darreld Mclean, MD  ibuprofen (ADVIL) 800 MG tablet Take 1 tablet (800 mg total) by mouth every 8 (eight) hours as needed for moderate pain. Take with food to prevent GI upest 08/26/21   Valentino Nose, NP  medroxyPROGESTERone Acetate 150 MG/ML SUSY 150 mg For office injection every 12 weeks 09/26/20   Adline Potter, NP  naproxen (NAPROSYN) 500 MG tablet Take 1 tablet (500 mg total) by mouth 2 (two) times daily with a meal. 08/29/21   Darreld Mclean, MD  [facility] HFA 108 [phone] Base) MCG/ACT inhaler Two puffs every 4 to 6 hours as needed for wheezing or coughing 10/26/19   Rosiland Oz, MD  RETIN-A 0.025 % cream SMARTSIG:Sparingly Topical Every Night 11/19/20   [provider]      Allergies    Patient has no known allergies.    Review of Systems   Review of Systems  Constitutional:  Negative for chills and fever.  Respiratory:  Negative for shortness of breath.   Cardiovascular:  Negative for chest pain.  Gastrointestinal:  Negative for abdominal pain.  Genitourinary:  Negative for difficulty urinating.  Musculoskeletal:  Positive for arthralgias (Right knee pain).  Skin:  Negative for color change and wound.  Neurological:  Negative for dizziness, weakness, numbness and headaches.    Physical Exam Updated  Vital Signs BP 125/79 (BP Location: Right Arm)   Pulse 74   Temp 98.7 F (37.1 C) (Oral)   Resp 18   Ht 5\' 1"  (1.549 m)   Wt 96.6 kg   SpO2 100%   BMI 40.25 kg/m  Physical Exam Vitals and nursing note reviewed.  Constitutional:      General: She is not in acute distress.    Appearance: Normal appearance. She is not ill-appearing.  HENT:     Head: Atraumatic.  Cardiovascular:     Rate and Rhythm: Normal rate and regular rhythm.     Pulses: Normal pulses.  Pulmonary:     Effort: Pulmonary effort is normal.     Breath sounds: Normal breath sounds.   Musculoskeletal:        General: Swelling, tenderness and signs of injury present. No deformity.     Right knee: Swelling present. No effusion, erythema or crepitus. Normal range of motion. Tenderness present over the ACL and patellar tendon. Normal alignment.     Comments: Tenderness with valgus and varus stress.  Mild edema anteriorly right knee.  No palpable effusion.  No excessive warmth, erythema.  She does have some tenderness on range of motion over the patellar tendon, but tendon appears intact.  Skin:    General: Skin is warm.     Capillary Refill: Capillary refill takes less than 2 seconds.     Findings: No bruising, erythema or rash.  Neurological:     General: No focal deficit present.     Mental Status: She is alert.     Sensory: No sensory deficit.     Motor: No weakness.     ED Results / Procedures / Treatments   Labs (all labs ordered are listed, but only abnormal results are displayed) Labs Reviewed - No data to display  EKG None  Radiology No results found.  Procedures Procedures    Medications Ordered in ED Medications - No data to display  ED Course/ Medical Decision Making/ A&P                           Medical Decision Making Patient here for evaluation of persistent right knee pain after mechanical fall in which she had a blow to the anterior knee.  Also questionable twisting injury.  She has been seen by orthopedics and has outpatient MRI of the knee scheduled.  There is concern for possible meniscal versus MCL injury.  On my exam, patient does have some mild edema of the anterior knee, there is no palpable effusion.  No excessive warmth or erythema.  There is no calf tenderness on exam.  I do not appreciate any edema of the upper leg.  Neurovascularly intact. She was advised to wear knee brace but patient is not wearing stating that it was poorly fitting and causing increased swelling of her leg.  Amount and/or Complexity of Data  Reviewed Discussion of management or test interpretation with external provider(s): Patient had x-ray of the right knee on 08/26/2021 that did not show evidence of fracture or dislocation.  Confirm that patient does have scheduled MRI of the knee for 09/10/2021.  Extremity is neurovascularly intact.  Differential would include musculoskeletal injury, ligamentous injury, DVT.  Clinically, I have low suspicion for DVT as symptoms are localized to the anterior knee and involve the joint.  No concerning symptoms for septic joint.  No indications for aspiration of the joint at this time, as  I do not appreciate any significant localized edema.  Patient agreeable to treatment plan with continued naproxen, ice, and will supply properly fitting brace.           Final Clinical Impression(s) / ED Diagnoses Final diagnoses:  Acute pain of right knee    Rx / DC Orders ED Discharge Orders     None         Pauline Aus, PA-C 09/08/21 1455    Lonell Grandchild, MD 09/08/21 1557

## 2021-09-08 NOTE — Discharge Instructions (Signed)
Continue to apply ice packs on and off to your knee.  Wear the brace as needed for walking and support.  Stop the ibuprofen and take the naproxen as directed.  Keep your upcoming appointment for your MRI and follow-up with orthopedics as directed.

## 2021-09-10 ENCOUNTER — Ambulatory Visit (HOSPITAL_COMMUNITY)
Admission: RE | Admit: 2021-09-10 | Discharge: 2021-09-10 | Disposition: A | Discharge status: Home / Self Care | Payer: Medicaid Other | Source: Ambulatory Visit | Attending: Orthopaedic Surgery | Admitting: Orthopaedic Surgery

## 2021-09-10 DIAGNOSIS — S83281A Other tear of lateral meniscus, current injury, right knee, initial encounter: Secondary | ICD-10-CM | POA: Diagnosis not present

## 2021-09-10 DIAGNOSIS — S838X1A Sprain of other specified parts of right knee, initial encounter: Secondary | ICD-10-CM | POA: Insufficient documentation

## 2021-09-12 ENCOUNTER — Ambulatory Visit: Payer: Medicaid Other | Admitting: Adult Health

## 2021-09-12 ENCOUNTER — Ambulatory Visit: Payer: Medicaid Other | Admitting: Orthopaedic Surgery

## 2021-09-16 ENCOUNTER — Other Ambulatory Visit (HOSPITAL_COMMUNITY): Payer: Medicaid Other

## 2021-09-24 ENCOUNTER — Ambulatory Visit (INDEPENDENT_AMBULATORY_CARE_PROVIDER_SITE_OTHER): Payer: Medicaid Other | Admitting: Orthopaedic Surgery

## 2021-09-24 ENCOUNTER — Encounter: Payer: Self-pay | Admitting: Orthopaedic Surgery

## 2021-09-24 DIAGNOSIS — M7061 Trochanteric bursitis, right hip: Secondary | ICD-10-CM | POA: Diagnosis not present

## 2021-09-24 DIAGNOSIS — S838X1A Sprain of other specified parts of right knee, initial encounter: Secondary | ICD-10-CM

## 2021-09-24 MED ORDER — PREDNISONE 5 MG (21) PO TBPK: ORAL_TABLET | ORAL | 0 refills | Status: DC

## 2021-09-24 NOTE — Progress Notes (Signed)
My knee is better but my right hip hurts  She had MRI of the right knee showing: IMPRESSION: 1. Tiny radial tear of the lateral meniscus midbody. 2. Borderline extrusion of the medial meniscus body without tear.  I have explained the findings of the MRI.  I would not recommend surgery and she is better also.  I have independently reviewed the MRI.    Right knee has full ROM and some lateral tenderness.  Stable.  NV intact.  She has pain of the right lateal hip area over the bursa area.  She has no trauma. ROM of the hip is full.    Encounter Diagnoses  Name Primary?   Meniscal injury, right, initial encounter Yes   Trochanteric bursitis, right hip    I will give prednisone dose pack.  Return in two weeks.  Call if any problem.  Precautions discussed.  Electronically Signed Darreld Mclean, MD 8/29/202310:49 AM

## 2021-10-08 ENCOUNTER — Ambulatory Visit: Payer: Medicaid Other | Admitting: Orthopaedic Surgery

## 2021-10-15 ENCOUNTER — Ambulatory Visit: Payer: Medicaid Other | Admitting: Orthopaedic Surgery

## 2021-10-30 ENCOUNTER — Emergency Department (HOSPITAL_COMMUNITY): Payer: Self-pay

## 2021-10-30 ENCOUNTER — Encounter (HOSPITAL_COMMUNITY): Payer: Self-pay | Admitting: Emergency Medicine

## 2021-10-30 ENCOUNTER — Emergency Department (HOSPITAL_COMMUNITY)
Admission: EM | Admit: 2021-10-30 | Discharge: 2021-10-31 | Disposition: A | Discharge status: Home / Self Care | Payer: Self-pay | Attending: Emergency Medicine | Admitting: Emergency Medicine

## 2021-10-30 DIAGNOSIS — Z79899 Other long term (current) drug therapy: Secondary | ICD-10-CM | POA: Insufficient documentation

## 2021-10-30 DIAGNOSIS — U071 COVID-19: Secondary | ICD-10-CM | POA: Insufficient documentation

## 2021-10-30 LAB — URINALYSIS, ROUTINE W REFLEX MICROSCOPIC
Bilirubin Urine: NEGATIVE
Glucose, UA: NEGATIVE mg/dL
Ketones, ur: NEGATIVE mg/dL
Nitrite: NEGATIVE
Protein, ur: NEGATIVE mg/dL
Specific Gravity, Urine: 1.02 (ref 1.005–1.030)
pH: 6 (ref 5.0–8.0)

## 2021-10-30 MED ORDER — ALBUTEROL SULFATE (2.5 MG/3ML) 0.083% IN NEBU
2.5000 mg | INHALATION_SOLUTION | RESPIRATORY_TRACT | Status: AC
  Administered 2021-10-30: 2.5 mg via RESPIRATORY_TRACT
  Filled 2021-10-30: qty 3

## 2021-10-30 MED ORDER — IPRATROPIUM-ALBUTEROL 0.5-2.5 (3) MG/3ML IN SOLN
3.0000 mL | Freq: Once | RESPIRATORY_TRACT | Status: AC
  Administered 2021-10-30: 3 mL via RESPIRATORY_TRACT
  Filled 2021-10-30: qty 3

## 2021-10-30 MED ORDER — ONDANSETRON 8 MG PO TBDP
8.0000 mg | ORAL_TABLET | Freq: Once | ORAL | Status: AC
  Administered 2021-10-30: 8 mg via ORAL
  Filled 2021-10-30: qty 1

## 2021-10-30 NOTE — ED Triage Notes (Signed)
Pt arrives POV c/o chest pains on and off for the past few days. Pt also states she has had a sore throat. States mother had COVID 2 days ago.

## 2021-10-31 LAB — SARS CORONAVIRUS 2 BY RT PCR: SARS Coronavirus 2 by RT PCR: POSITIVE — AB

## 2021-10-31 LAB — POC URINE PREG, ED: Preg Test, Ur: NEGATIVE

## 2021-10-31 MED ORDER — ALBUTEROL SULFATE HFA 108 (90 BASE) MCG/ACT IN AERS
2.0000 | INHALATION_SPRAY | RESPIRATORY_TRACT | Status: DC | PRN
  Administered 2021-10-31: 2 via RESPIRATORY_TRACT
  Filled 2021-10-31: qty 6.7

## 2021-10-31 MED ORDER — PREDNISONE 20 MG PO TABS: 40.0000 mg | ORAL_TABLET | Freq: Every day | ORAL | 0 refills | Status: DC

## 2021-10-31 MED ORDER — NIRMATRELVIR/RITONAVIR (PAXLOVID)TABLET: 3.0000 | ORAL_TABLET | Freq: Two times a day (BID) | ORAL | 0 refills | Status: AC

## 2021-10-31 MED ORDER — PREDNISONE 50 MG PO TABS
60.0000 mg | ORAL_TABLET | Freq: Once | ORAL | Status: AC
  Administered 2021-10-31: 60 mg via ORAL
  Filled 2021-10-31: qty 1

## 2021-10-31 MED ORDER — ALBUTEROL SULFATE HFA 108 (90 BASE) MCG/ACT IN AERS: 2.0000 | INHALATION_SPRAY | RESPIRATORY_TRACT | 2 refills | Status: AC | PRN

## 2021-10-31 NOTE — ED Provider Notes (Signed)
[facility] EMERGENCY DEPARTMENT Provider Note   CSN: [id number] Arrival date & time: 10/30/21  2245     History  Chief Complaint  Patient presents with   Chest Pain    Allison Key is a 21 y.o. female.  Patient presents to the emergency department for evaluation of chest pains and asthma symptoms.  Symptoms began 2 days ago with a sore throat and then she developed a cough.  She has used her inhaler with some relief.       Home Medications Prior to Admission medications   Medication Sig Start Date End Date Taking? Authorizing Provider  albuterol (VENTOLIN HFA) 108 (90 Base) MCG/ACT inhaler Inhale 2 puffs into the lungs every 4 (four) hours as needed for wheezing or shortness of breath. 10/31/21  Yes Dezhane Staten, Canary Brim, MD  nirmatrelvir/ritonavir EUA (PAXLOVID) 20 x 150 MG & 10 x 100MG  TABS Take 3 tablets by mouth 2 (two) times daily for 5 days. Patient GFR is >60. Take nirmatrelvir (150 mg) two tablets twice daily for 5 days and ritonavir (100 mg) one tablet twice daily for 5 days. 10/31/21 11/05/21 Yes Tierra Divelbiss, 01/05/22, MD  predniSONE (DELTASONE) 20 MG tablet Take 2 tablets (40 mg total) by mouth daily with breakfast. 10/31/21  Yes Ethelbert Thain, 12/31/21, MD  aspirin-acetaminophen-caffeine (EXCEDRIN MIGRAINE) [phone] MG tablet Take 2 tablets by mouth every 6 (six) hours as needed for headache.    [provider]  clindamycin (CLEOCIN-T) 1 % gel Apply topically 2 (two) times daily. 02/27/21   04/27/21, NP  clindamycin-benzoyl peroxide (BENZACLIN WITH PUMP) gel Dispense generic brand. Apply to acne on face twice a day after washing with acne soap 03/06/17   05/04/17, MD  DIFFERIN 0.1 % cream Dispense Brand Name. Apply to acne at night after washing face with acne soap 07/21/16   07/23/16, MD  fluticasone [facility]) 50 MCG/ACT nasal spray INHALE 2 SPRAYS IN EACH NOSTRIL ONCE DAILY. 11/20/16   11/22/16, MD   HYDROcodone-acetaminophen (NORCO/VICODIN) 5-325 MG tablet One tablet every four hours for pain. 08/29/21   10/29/21, MD  medroxyPROGESTERone Acetate 150 MG/ML SUSY 150 mg For office injection every 12 weeks 09/26/20   09/28/20 A, NP  naproxen (NAPROSYN) 500 MG tablet Take 1 tablet (500 mg total) by mouth 2 (two) times daily. Take with food 09/08/21   Triplett, Tammy, PA-C  RETIN-A 0.025 % cream SMARTSIG:Sparingly Topical Every Night 11/19/20   [provider]      Allergies    Patient has no known allergies.    Review of Systems   Review of Systems  Physical Exam Updated Vital Signs BP 122/66   Pulse 99   Temp 97.8 F (36.6 C) (Oral)   Resp 18   Ht 5\' 1"  (1.549 m)   Wt 96.6 kg   SpO2 99%   BMI 40.24 kg/m  Physical Exam  ED Results / Procedures / Treatments   Labs (all labs ordered are listed, but only abnormal results are displayed) Labs Reviewed  SARS CORONAVIRUS 2 BY RT PCR - Abnormal; Notable for the following components:      Result Value   SARS Coronavirus 2 by RT PCR POSITIVE (*)    All other components within normal limits  URINALYSIS, ROUTINE W REFLEX MICROSCOPIC - Abnormal; Notable for the following components:   APPearance HAZY (*)    Hgb urine dipstick MODERATE (*)    Leukocytes,Ua SMALL (*)  Bacteria, UA RARE (*)    All other components within normal limits  POC URINE PREG, ED    EKG EKG Interpretation  Date/Time:  Wednesday October 30 2021 23:11:40 EDT Ventricular Rate:  84 PR Interval:  142 QRS Duration: 68 QT Interval:  348 QTC Calculation: 412 R Axis:   53 Text Interpretation: Sinus rhythm Low voltage, precordial leads Baseline wander in lead(s) V4 V5 V6 Confirmed by Orpah Greek [phone]) on 10/30/2021 11:19:45 PM  Radiology DG Chest 2 View  Result Date: 10/30/2021 CLINICAL DATA:  Chest pain and shortness of breath. EXAM: CHEST - 2 VIEW COMPARISON:  February 28, 2010 FINDINGS: The heart size and mediastinal  contours are within normal limits. Both lungs are clear. The visualized skeletal structures are unremarkable. IMPRESSION: No active cardiopulmonary disease. Electronically Signed   By: Virgina Norfolk M.D.   On: 10/30/2021 23:43    Procedures Procedures    Medications Ordered in ED Medications  predniSONE (DELTASONE) tablet 60 mg (has no administration in time range)  albuterol (VENTOLIN HFA) 108 (90 Base) MCG/ACT inhaler 2 puff (has no administration in time range)  ondansetron (ZOFRAN-ODT) disintegrating tablet 8 mg (8 mg Oral Given 10/30/21 2316)  ipratropium-albuterol (DUONEB) 0.5-2.5 (3) MG/3ML nebulizer solution 3 mL (3 mLs Nebulization Given 10/30/21 2339)  albuterol (PROVENTIL) (2.5 MG/3ML) 0.083% nebulizer solution 2.5 mg (2.5 mg Nebulization Given 10/30/21 2339)    ED Course/ Medical Decision Making/ A&P                           Medical Decision Making Amount and/or Complexity of Data Reviewed Labs: ordered. Radiology: ordered.  Risk Prescription drug management.   Patient presents to the emergency department for evaluation of chest pains, predominantly with coughing, URI symptoms.  Patient reports that her mother was recently diagnosed with COVID.  Patient does have a history of asthma.  She is currently comfortable, no respiratory distress.  She is not hypoxic.  Patient has had some nausea and vomiting associated with her symptoms.  Urinalysis not suggestive of UTI/pyelonephritis.  She is not pregnant.  Chest x-ray without evidence of pneumonia.  Patient's COVID is positive, which is felt to explain her constellation of symptoms.  Treat asthma aggressively, offer antiviral because of her chronic lung disease.        Final Clinical Impression(s) / ED Diagnoses Final diagnoses:  COVID-19    Rx / DC Orders ED Discharge Orders          Ordered    nirmatrelvir/ritonavir EUA (PAXLOVID) 20 x 150 MG & 10 x 100MG  TABS  2 times daily        10/31/21 0146    predniSONE  (DELTASONE) 20 MG tablet  Daily with breakfast        10/31/21 0146    albuterol (VENTOLIN HFA) 108 (90 Base) MCG/ACT inhaler  Every 4 hours PRN        10/31/21 0146              Orpah Greek, MD 10/31/21 [phone]

## 2021-11-05 ENCOUNTER — Ambulatory Visit: Payer: Self-pay | Admitting: Women's Health

## 2021-11-15 ENCOUNTER — Ambulatory Visit: Payer: Medicaid Other

## 2021-12-03 ENCOUNTER — Ambulatory Visit (INDEPENDENT_AMBULATORY_CARE_PROVIDER_SITE_OTHER): Payer: Medicaid Other | Admitting: *Deleted

## 2021-12-03 DIAGNOSIS — Z3202 Encounter for pregnancy test, result negative: Secondary | ICD-10-CM

## 2021-12-03 DIAGNOSIS — Z3042 Encounter for surveillance of injectable contraceptive: Secondary | ICD-10-CM

## 2021-12-03 LAB — POCT URINE PREGNANCY: Preg Test, Ur: NEGATIVE

## 2021-12-03 MED ORDER — MEDROXYPROGESTERONE ACETATE 150 MG/ML IM SUSP
150.0000 mg | Freq: Once | INTRAMUSCULAR | Status: AC
  Administered 2021-12-03: 150 mg via INTRAMUSCULAR

## 2021-12-03 NOTE — Progress Notes (Signed)
   NURSE VISIT- INJECTION  SUBJECTIVE:  Allison Key is a 21 y.o. G0P0000 female here for a Depo Provera for contraception/period management. She is a GYN patient.  Pt is late for her depo injection, she denies intercourse in the past two weeks and has a negative urine pregnancy test. Pt aware to schedule annual with pap smear prior to her next depo appointment but after her birthday.   OBJECTIVE:  There were no vitals taken for this visit.  Appears well, in no apparent distress  Injection administered in: Right deltoid  Meds ordered this encounter  Medications   medroxyPROGESTERone (DEPO-PROVERA) injection 150 mg    ASSESSMENT: GYN patient Depo Provera for contraception/period management PLAN: Follow-up: in 11-13 weeks for next Depo with annual exam prior to next depo for refills.    Allison Key  12/03/2021 8:45 AM

## 2022-01-14 ENCOUNTER — Ambulatory Visit: Payer: Medicaid Other | Admitting: Women's Health

## 2022-02-05 ENCOUNTER — Other Ambulatory Visit (HOSPITAL_COMMUNITY)
Admission: RE | Admit: 2022-02-05 | Discharge: 2022-02-05 | Disposition: A | Discharge status: Home / Self Care | Payer: Medicaid Other | Source: Ambulatory Visit | Attending: Women's Health | Admitting: Women's Health

## 2022-02-05 ENCOUNTER — Ambulatory Visit (INDEPENDENT_AMBULATORY_CARE_PROVIDER_SITE_OTHER): Payer: Medicaid Other | Admitting: Adult Health

## 2022-02-05 ENCOUNTER — Encounter: Payer: Self-pay | Admitting: Adult Health

## 2022-02-05 VITALS — BP 131/88 | HR 90 | Ht 62.0 in | Wt 222.5 lb

## 2022-02-05 DIAGNOSIS — Z3042 Encounter for surveillance of injectable contraceptive: Secondary | ICD-10-CM | POA: Insufficient documentation

## 2022-02-05 DIAGNOSIS — Z0000 Encounter for general adult medical examination without abnormal findings: Secondary | ICD-10-CM | POA: Insufficient documentation

## 2022-02-05 DIAGNOSIS — Z01419 Encounter for gynecological examination (general) (routine) without abnormal findings: Secondary | ICD-10-CM | POA: Diagnosis not present

## 2022-02-05 DIAGNOSIS — F4310 Post-traumatic stress disorder, unspecified: Secondary | ICD-10-CM

## 2022-02-05 MED ORDER — MEDROXYPROGESTERONE ACETATE 150 MG/ML IM SUSY: PREFILLED_SYRINGE | INTRAMUSCULAR | 4 refills | Status: DC
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
## 2022-02-10 LAB — CYTOLOGY - PAP
Chlamydia: NEGATIVE
Comment: NEGATIVE
Comment: NORMAL
Diagnosis: NEGATIVE
Neisseria Gonorrhea: NEGATIVE

## 2022-02-18 ENCOUNTER — Ambulatory Visit (INDEPENDENT_AMBULATORY_CARE_PROVIDER_SITE_OTHER): Payer: Medicaid Other | Admitting: *Deleted

## 2022-02-18 DIAGNOSIS — Z3042 Encounter for surveillance of injectable contraceptive: Secondary | ICD-10-CM

## 2022-02-18 MED ORDER — MEDROXYPROGESTERONE ACETATE 150 MG/ML IM SUSP
150.0000 mg | Freq: Once | INTRAMUSCULAR | Status: AC
  Administered 2022-02-18: 150 mg via INTRAMUSCULAR

## 2022-02-18 NOTE — Progress Notes (Signed)
   NURSE VISIT- INJECTION  SUBJECTIVE:  Allison Key is a 22 y.o. G0P0000 female here for a Depo Provera for contraception/period management. She is a GYN patient.   OBJECTIVE:  There were no vitals taken for this visit.  Appears well, in no apparent distress  Injection administered in: Left deltoid  Meds ordered this encounter  Medications   medroxyPROGESTERone (DEPO-PROVERA) injection 150 mg    ASSESSMENT: GYN patient Depo Provera for contraception/period management PLAN: Follow-up: in 11-13 weeks for next Depo   Alice Rieger  02/18/2022 2:46 PM

## 2022-03-25 ENCOUNTER — Other Ambulatory Visit: Payer: Self-pay

## 2022-03-25 ENCOUNTER — Encounter (HOSPITAL_COMMUNITY): Payer: Self-pay

## 2022-03-25 ENCOUNTER — Emergency Department (HOSPITAL_COMMUNITY): Payer: Medicaid Other

## 2022-03-25 ENCOUNTER — Emergency Department (HOSPITAL_COMMUNITY)
Admission: EM | Admit: 2022-03-25 | Discharge: 2022-03-25 | Disposition: A | Discharge status: Home / Self Care | Payer: Medicaid Other | Attending: Emergency Medicine | Admitting: Emergency Medicine

## 2022-03-25 DIAGNOSIS — M25461 Effusion, right knee: Secondary | ICD-10-CM

## 2022-03-25 DIAGNOSIS — M25551 Pain in right hip: Secondary | ICD-10-CM | POA: Diagnosis not present

## 2022-03-25 DIAGNOSIS — Z043 Encounter for examination and observation following other accident: Secondary | ICD-10-CM | POA: Diagnosis not present

## 2022-03-25 LAB — PREGNANCY, URINE: Preg Test, Ur: NEGATIVE

## 2022-03-25 NOTE — Discharge Instructions (Addendum)
Schedule to see the Orthopaedist for evaluation

## 2022-03-25 NOTE — ED Triage Notes (Signed)
Reports she fell last summer [place] [employer] and hurt hip and knee but reports she fell going up steps last night and having worsening right hip and knee pain.  Patient ambulatory to triage with steady gait.  Reports it hurts to be in a seated position and she can't lay on that side.

## 2022-03-25 NOTE — ED Provider Notes (Signed)
[facility] Provider Note   CSN: [id number] Arrival date & time: 03/25/22  1502     History  Chief Complaint  Patient presents with   Hip Pain    [place] is a 22 y.o. female.  Patient reports she had a medial meniscus tear last summer.  Patient reports she saw the orthopedist for initial evaluation but did not follow-up.  Patient reports last night she was going upstairs and she had pain in her knee and her hip.  Patient complains of difficulty ambulating today.  Patient complains of swelling to her right knee.  The history is provided by the patient. No language interpreter was used.  Hip Pain This is a new problem.  Knee Pain Pain details:    Quality:  Aching   Radiates to:  Does not radiate   Severity:  Moderate   Onset quality:  Gradual   Timing:  Constant   Progression:  Worsening Chronicity:  New Relieved by:  Nothing Worsened by:  Nothing Ineffective treatments:  None tried      Home Medications Prior to Admission medications   Medication Sig Start Date End Date Taking? Authorizing Provider  albuterol (VENTOLIN HFA) 108 (90 Base) MCG/ACT inhaler Inhale 2 puffs into the lungs every 4 (four) hours as needed for wheezing or shortness of breath. 10/31/21   Orpah Greek, MD  aspirin-acetaminophen-caffeine (EXCEDRIN MIGRAINE) [phone] MG tablet Take 2 tablets by mouth every 6 (six) hours as needed for headache.    [provider]  clindamycin (CLEOCIN-T) 1 % gel Apply topically 2 (two) times daily. 02/27/21   Estill Dooms, NP  clindamycin-benzoyl peroxide (BENZACLIN WITH PUMP) gel Dispense generic brand. Apply to acne on face twice a day after washing with acne soap 03/06/17   Fransisca Connors, MD  DIFFERIN 0.1 % cream Dispense Brand Name. Apply to acne at night after washing face with acne soap 07/21/16   Fransisca Connors, MD  fluticasone [facility]) 50 MCG/ACT nasal spray INHALE 2 SPRAYS  IN EACH NOSTRIL ONCE DAILY. 11/20/16   Fransisca Connors, MD  medroxyPROGESTERone Acetate 150 MG/ML SUSY 150 mg For office injection every 12 weeks 02/05/22   Derrek Monaco A, NP  RETIN-A 0.025 % cream SMARTSIG:Sparingly Topical Every Night 11/19/20   [provider]      Allergies    Patient has no known allergies.    Review of Systems   Review of Systems  All other systems reviewed and are negative.   Physical Exam Updated Vital Signs BP 128/89 (BP Location: Right Arm)   Pulse 84   Temp 98.5 F (36.9 C) (Oral)   Resp 18   Ht '5\' 2"'$  (1.575 m)   Wt 100.7 kg   SpO2 100%   BMI 40.60 kg/m  Physical Exam Vitals and nursing note reviewed.  Constitutional:      Appearance: She is well-developed.  HENT:     Head: Normocephalic.  Pulmonary:     Effort: Pulmonary effort is normal.  Abdominal:     General: There is no distension.  Musculoskeletal:        General: Swelling and tenderness present. Normal range of motion.     Cervical back: Normal range of motion.     Comments: Vision right knee decreased range of motion neurovascular neurosensory are intact  Neurological:     Mental Status: She is alert and oriented to person, place, and time.  Psychiatric:  Mood and Affect: Mood normal.     ED Results / Procedures / Treatments   Labs (all labs ordered are listed, but only abnormal results are displayed) Labs Reviewed  PREGNANCY, URINE    EKG None  Radiology DG Hip Unilat W or Wo Pelvis 2-3 Views Right  Result Date: 03/25/2022 CLINICAL DATA:  Right hip pain after fall. EXAM: DG HIP (WITH OR WITHOUT PELVIS) 2-3V RIGHT COMPARISON:  None Available. FINDINGS: There is no evidence of hip fracture or dislocation. There is no evidence of arthropathy or other focal bone abnormality. IMPRESSION: Negative. Electronically Signed   By: Marijo Conception M.D.   On: 03/25/2022 16:21   DG Knee Complete 4 Views Right  Result Date: 03/25/2022 CLINICAL DATA:  Fall  EXAM: RIGHT KNEE - COMPLETE 4+ VIEW COMPARISON:  08/26/2021 FINDINGS: No evidence of fracture, dislocation, or joint effusion. No evidence of arthropathy or other focal bone abnormality. Soft tissues are unremarkable. IMPRESSION: No fracture or dislocation of the right knee. Joint spaces are preserved. Electronically Signed   By: Delanna Ahmadi M.D.   On: 03/25/2022 16:11    Procedures Procedures    Medications Ordered in ED Medications - No data to display  ED Course/ Medical Decision Making/ A&P                             Medical Decision Making Patient complains of pain and swelling to her right knee after going up stairs.  Patient had a meniscus tear in August of last year  Amount and/or Complexity of Data Reviewed External Data Reviewed: notes.    Details: Orthopedics notes reviewed Labs: ordered. Radiology: ordered and independent interpretation performed. Decision-making details documented in ED Course.    Details: X-ray right hip and right knee no evidence of fracture  Risk Risk Details: Patient has a knee brace at home patient is advised to wear her brace patient is given crutches to assist with ambulation.  Patient is advised to follow-up with orthopedist for further evaluation.  Patient is advised ice and ibuprofen           Final Clinical Impression(s) / ED Diagnoses Final diagnoses:  Knee effusion, right    Rx / DC Orders ED Discharge Orders     None      An After Visit Summary was printed and given to the patient.    Fransico Meadow, [place] 03/25/22 [place], MD 03/28/22 [phone]

## 2022-03-27 ENCOUNTER — Telehealth: Payer: Self-pay | Admitting: Obstetrics and Gynecology

## 2022-03-27 ENCOUNTER — Encounter: Payer: Self-pay | Admitting: Radiology

## 2022-03-27 DIAGNOSIS — M25561 Pain in right knee: Secondary | ICD-10-CM | POA: Diagnosis not present

## 2022-03-27 NOTE — Transitions of Care (Post Inpatient/ED Visit) (Signed)
   03/27/2022  Name: VAUNA JUNGWIRTH MRN: [id number] DOB: [birth year]  Today's TOC FU Call Status: Today's TOC FU Call Status:: Unsuccessul Call (1st Attempt) Unsuccessful Call (1st Attempt) Date: 03/27/22  Attempted to reach the patient regarding the most recent Inpatient/ED visit.  Follow Up Plan: Additional outreach attempts will be made to reach the patient to complete the Transitions of Care (Post Inpatient/ED visit) call.   Aida Raider RN, BSN [place]  [facility] [employer] - Managed Medicaid High Risk [phone]

## 2022-04-04 DIAGNOSIS — M25561 Pain in right knee: Secondary | ICD-10-CM | POA: Diagnosis not present

## 2022-04-04 DIAGNOSIS — M25551 Pain in right hip: Secondary | ICD-10-CM | POA: Diagnosis not present

## 2022-04-16 DIAGNOSIS — M25561 Pain in right knee: Secondary | ICD-10-CM | POA: Diagnosis not present

## 2022-04-16 DIAGNOSIS — M23300 Other meniscus derangements, unspecified lateral meniscus, right knee: Secondary | ICD-10-CM | POA: Diagnosis not present

## 2022-05-13 ENCOUNTER — Ambulatory Visit (INDEPENDENT_AMBULATORY_CARE_PROVIDER_SITE_OTHER): Payer: Medicaid Other | Admitting: *Deleted

## 2022-05-13 DIAGNOSIS — Z3042 Encounter for surveillance of injectable contraceptive: Secondary | ICD-10-CM

## 2022-05-13 MED ORDER — MEDROXYPROGESTERONE ACETATE 150 MG/ML IM SUSY
150.0000 mg | PREFILLED_SYRINGE | Freq: Once | INTRAMUSCULAR | Status: AC
  Administered 2022-05-13: 150 mg via INTRAMUSCULAR

## 2022-05-13 NOTE — Progress Notes (Signed)
   NURSE VISIT- INJECTION  SUBJECTIVE:  Allison Key is a 21 y.o. G0P0000 female here for a Depo Provera for contraception/period management. She is a GYN patient.   OBJECTIVE:  There were no vitals taken for this visit.  Appears well, in no apparent distress  Injection administered in: Right deltoid  Meds ordered this encounter  Medications   medroxyPROGESTERone Acetate SUSY 150 mg    ASSESSMENT: GYN patient Depo Provera for contraception/period management PLAN: Follow-up: in 11-13 weeks for next Depo   Jobe Marker  05/13/2022 2:45 PM

## 2022-05-15 ENCOUNTER — Telehealth: Payer: Self-pay | Admitting: *Deleted

## 2022-05-15 MED ORDER — MEGESTROL ACETATE 40 MG PO TABS: ORAL_TABLET | ORAL | 1 refills | Status: DC

## 2022-05-15 NOTE — Telephone Encounter (Signed)
Pt reports that she got her depo on 4/16. She has been bleeding very heavy since that time and is soaking through pads and changing them frequently. Spoke with Victorino Dike about patient, she will send in megace but if not doing better or feels worse pt to go to ER. Pt agreeable.

## 2022-05-15 NOTE — Telephone Encounter (Signed)
Rx sent for megace

## 2022-05-15 NOTE — Addendum Note (Signed)
Addended by: Cyril Mourning A on: 05/15/2022 11:51 AM   Modules accepted: Orders

## 2022-08-05 ENCOUNTER — Ambulatory Visit: Payer: Medicaid Other

## 2022-08-05 VITALS — BP 133/82 | HR 102

## 2022-08-05 DIAGNOSIS — Z3042 Encounter for surveillance of injectable contraceptive: Secondary | ICD-10-CM

## 2022-08-05 MED ORDER — MEDROXYPROGESTERONE ACETATE 150 MG/ML IM SUSP
150.0000 mg | Freq: Once | INTRAMUSCULAR | Status: AC
  Administered 2022-08-05: 150 mg via INTRAMUSCULAR

## 2022-08-05 NOTE — Progress Notes (Signed)
   NURSE VISIT- INJECTION  SUBJECTIVE:  Allison Key is a 22 y.o. G0P0000 female here for a Depo Provera for contraception/period management. She is a GYN patient. Last Depo Medrol 05/13/22.   OBJECTIVE:  There were no vitals taken for this visit.  Appears well, in no apparent distress. Patient reports after last injection had bleeding for about 1.5 weeks. Was given Megace and bleeding stopped. Will report if bleeding recurs after this injection.   Injection administered in: Right Ventrogluteal  No orders of the defined types were placed in this encounter.   ASSESSMENT: GYN patient Depo Provera for contraception/period management PLAN: Follow-up: in 11-13 weeks for next Depo   Regis Bill  08/05/2022 9:11 AM

## 2022-09-08 ENCOUNTER — Encounter (HOSPITAL_COMMUNITY): Payer: Self-pay

## 2022-09-08 ENCOUNTER — Emergency Department (HOSPITAL_COMMUNITY)
Admission: EM | Admit: 2022-09-08 | Discharge: 2022-09-08 | Disposition: A | Discharge status: Home / Self Care | Payer: Medicaid Other | Attending: Emergency Medicine | Admitting: Emergency Medicine

## 2022-09-08 DIAGNOSIS — Z7982 Long term (current) use of aspirin: Secondary | ICD-10-CM | POA: Diagnosis not present

## 2022-09-08 DIAGNOSIS — R109 Unspecified abdominal pain: Secondary | ICD-10-CM | POA: Diagnosis present

## 2022-09-08 DIAGNOSIS — R1033 Periumbilical pain: Secondary | ICD-10-CM | POA: Diagnosis not present

## 2022-09-08 DIAGNOSIS — Z7951 Long term (current) use of inhaled steroids: Secondary | ICD-10-CM | POA: Insufficient documentation

## 2022-09-08 DIAGNOSIS — R197 Diarrhea, unspecified: Secondary | ICD-10-CM | POA: Diagnosis not present

## 2022-09-08 DIAGNOSIS — Z20822 Contact with and (suspected) exposure to covid-19: Secondary | ICD-10-CM | POA: Diagnosis not present

## 2022-09-08 DIAGNOSIS — J45909 Unspecified asthma, uncomplicated: Secondary | ICD-10-CM | POA: Insufficient documentation

## 2022-09-08 DIAGNOSIS — R110 Nausea: Secondary | ICD-10-CM

## 2022-09-08 DIAGNOSIS — R112 Nausea with vomiting, unspecified: Secondary | ICD-10-CM | POA: Insufficient documentation

## 2022-09-08 LAB — COMPREHENSIVE METABOLIC PANEL
ALT: 13 U/L (ref 0–44)
AST: 17 U/L (ref 15–41)
Albumin: 3.7 g/dL (ref 3.5–5.0)
Alkaline Phosphatase: 104 U/L (ref 38–126)
Anion gap: 9 (ref 5–15)
BUN: 10 mg/dL (ref 6–20)
CO2: 21 mmol/L — ABNORMAL LOW (ref 22–32)
Calcium: 8.9 mg/dL (ref 8.9–10.3)
Chloride: 108 mmol/L (ref 98–111)
Creatinine, Ser: 0.77 mg/dL (ref 0.44–1.00)
GFR, Estimated: >60 mL/min (ref >60)
Glucose, Bld: 90 mg/dL (ref 70–99)
Potassium: 4 mmol/L (ref 3.5–5.1)
Sodium: 138 mmol/L (ref 135–145)
Total Bilirubin: 0.7 mg/dL (ref 0.3–1.2)
Total Protein: 7.2 g/dL (ref 6.5–8.1)

## 2022-09-08 LAB — CBC WITH DIFFERENTIAL/PLATELET
Abs Immature Granulocytes: 0.03 10*3/uL (ref 0.00–0.07)
Basophils Absolute: 0 10*3/uL (ref 0.0–0.1)
Basophils Relative: 0 %
Eosinophils Absolute: 0.2 10*3/uL (ref 0.0–0.5)
Eosinophils Relative: 2 %
HCT: 42.9 % (ref 36.0–46.0)
Hemoglobin: 14.4 g/dL (ref 12.0–15.0)
Immature Granulocytes: 0 %
Lymphocytes Relative: 32 %
Lymphs Abs: 3 10*3/uL (ref 0.7–4.0)
MCH: 28.8 pg (ref 26.0–34.0)
MCHC: 33.6 g/dL (ref 30.0–36.0)
MCV: 85.8 fL (ref 80.0–100.0)
Monocytes Absolute: 0.5 10*3/uL (ref 0.1–1.0)
Monocytes Relative: 6 %
Neutro Abs: 5.7 10*3/uL (ref 1.7–7.7)
Neutrophils Relative %: 60 %
Platelets: 307 10*3/uL (ref 150–400)
RBC: 5 MIL/uL (ref 3.87–5.11)
RDW: 12.6 % (ref 11.5–15.5)
WBC: 9.5 10*3/uL (ref 4.0–10.5)
nRBC: 0 % (ref 0.0–0.2)

## 2022-09-08 LAB — URINALYSIS, ROUTINE W REFLEX MICROSCOPIC
Bacteria, UA: NONE SEEN
Bilirubin Urine: NEGATIVE
Glucose, UA: NEGATIVE mg/dL
Ketones, ur: NEGATIVE mg/dL
Leukocytes,Ua: NEGATIVE
Nitrite: NEGATIVE
Protein, ur: NEGATIVE mg/dL
Specific Gravity, Urine: 1.021 (ref 1.005–1.030)
pH: 7 (ref 5.0–8.0)

## 2022-09-08 LAB — LIPASE, BLOOD: Lipase: 25 U/L (ref 11–51)

## 2022-09-08 LAB — RESP PANEL BY RT-PCR (RSV, FLU A&B, COVID)  RVPGX2
Influenza A by PCR: NEGATIVE
Influenza B by PCR: NEGATIVE
Resp Syncytial Virus by PCR: NEGATIVE
SARS Coronavirus 2 by RT PCR: NEGATIVE

## 2022-09-08 LAB — POC URINE PREG, ED: Preg Test, Ur: NEGATIVE

## 2022-09-08 MED ORDER — IBUPROFEN 400 MG PO TABS
600.0000 mg | ORAL_TABLET | Freq: Once | ORAL | Status: AC
  Administered 2022-09-08: 600 mg via ORAL
  Filled 2022-09-08: qty 2

## 2022-09-08 MED ORDER — ONDANSETRON 4 MG PO TBDP
4.0000 mg | ORAL_TABLET | Freq: Once | ORAL | Status: AC
  Administered 2022-09-08: 4 mg via ORAL
  Filled 2022-09-08: qty 1

## 2022-09-08 MED ORDER — ONDANSETRON 4 MG PO TBDP: 4.0000 mg | ORAL_TABLET | Freq: Three times a day (TID) | ORAL | 0 refills | Status: DC | PRN

## 2022-09-08 NOTE — ED Triage Notes (Signed)
Pt with abd pain, vomiting and diarrhea x 3 days, has been exposed to covid at work.
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
## 2022-09-08 NOTE — Discharge Instructions (Addendum)
Please take your medications as prescribed. Take tylenol/ibuprofen for pain. I recommend close follow-up with PCP for reevaluation.  Please do not hesitate to return to emergency department if worrisome signs symptoms we discussed become apparent.

## 2022-09-26 ENCOUNTER — Telehealth: Payer: Self-pay

## 2022-09-26 NOTE — Telephone Encounter (Signed)
Once the pt answer the call, introduce myself, after saying where I was calling from pt hang up.

## 2022-10-09 ENCOUNTER — Encounter: Payer: Self-pay | Admitting: *Deleted

## 2022-10-28 ENCOUNTER — Ambulatory Visit: Payer: Medicaid Other

## 2023-04-14 DIAGNOSIS — M25512 Pain in left shoulder: Secondary | ICD-10-CM | POA: Diagnosis not present

## 2023-04-14 DIAGNOSIS — M62830 Muscle spasm of back: Secondary | ICD-10-CM | POA: Diagnosis not present

## 2023-04-15 ENCOUNTER — Encounter (HOSPITAL_COMMUNITY): Payer: Self-pay

## 2023-04-15 ENCOUNTER — Emergency Department (HOSPITAL_COMMUNITY)

## 2023-04-15 ENCOUNTER — Emergency Department (HOSPITAL_COMMUNITY)
Admission: EM | Admit: 2023-04-15 | Discharge: 2023-04-15 | Disposition: A | Discharge status: Home / Self Care | Attending: Emergency Medicine | Admitting: Emergency Medicine

## 2023-04-15 ENCOUNTER — Other Ambulatory Visit: Payer: Self-pay

## 2023-04-15 ENCOUNTER — Ambulatory Visit: Admitting: Advanced Practice Midwife

## 2023-04-15 DIAGNOSIS — S46912A Strain of unspecified muscle, fascia and tendon at shoulder and upper arm level, left arm, initial encounter: Secondary | ICD-10-CM

## 2023-04-15 DIAGNOSIS — Y92810 Car as the place of occurrence of the external cause: Secondary | ICD-10-CM | POA: Insufficient documentation

## 2023-04-15 DIAGNOSIS — Y9389 Activity, other specified: Secondary | ICD-10-CM | POA: Insufficient documentation

## 2023-04-15 DIAGNOSIS — M25512 Pain in left shoulder: Secondary | ICD-10-CM | POA: Diagnosis not present

## 2023-04-15 DIAGNOSIS — X501XXA Overexertion from prolonged static or awkward postures, initial encounter: Secondary | ICD-10-CM | POA: Diagnosis not present

## 2023-04-15 MED ORDER — NAPROXEN 250 MG PO TABS
500.0000 mg | ORAL_TABLET | Freq: Once | ORAL | Status: AC
  Administered 2023-04-15: 500 mg via ORAL
  Filled 2023-04-15: qty 2

## 2023-04-15 MED ORDER — METHOCARBAMOL 500 MG PO TABS
750.0000 mg | ORAL_TABLET | Freq: Once | ORAL | Status: AC
  Administered 2023-04-15: 750 mg via ORAL
  Filled 2023-04-15: qty 2

## 2023-04-15 MED ORDER — METHOCARBAMOL 750 MG PO TABS: 750.0000 mg | ORAL_TABLET | Freq: Three times a day (TID) | ORAL | 0 refills | Status: DC

## 2023-04-15 MED ORDER — NAPROXEN 500 MG PO TABS: 500.0000 mg | ORAL_TABLET | Freq: Two times a day (BID) | ORAL | 0 refills | Status: DC

## 2023-04-15 NOTE — ED Provider Notes (Signed)
 [place] EMERGENCY DEPARTMENT AT [facility] Provider Note   CSN: [id number] Arrival date & time: 04/15/23  1637     History  Chief Complaint  Patient presents with   Shoulder Pain    Allison Key is a 23 y.o. female.  Patient is a 23 year old female who presents to the emergency department the chief complaint of left shoulder pain which has been ongoing since yesterday.  Patient notes that she did wash her car yesterday and is unsure if she may have injured herself.  She notes that this morning when attempting to lift her arm she felt a pop in her shoulder and has been experiencing pain since that time.  She notes that pain is worse along the superior aspect of the shoulder.  She denies any associated chest pain or shortness of breath.  She denies any associated numbness or paresthesias distally.  She has had no recent falls or direct blunt trauma.   Shoulder Pain      Home Medications Prior to Admission medications   Medication Sig Start Date End Date Taking? Authorizing Provider  albuterol (VENTOLIN HFA) 108 (90 Base) MCG/ACT inhaler Inhale 2 puffs into the lungs every 4 (four) hours as needed for wheezing or shortness of breath. 10/31/21   Gilda Crease, MD  aspirin-acetaminophen-caffeine (EXCEDRIN MIGRAINE) [phone] MG tablet Take 2 tablets by mouth every 6 (six) hours as needed for headache.    [provider]  clindamycin (CLEOCIN-T) 1 % gel Apply topically 2 (two) times daily. 02/27/21   Adline Potter, NP  clindamycin-benzoyl peroxide (BENZACLIN WITH PUMP) gel Dispense generic brand. Apply to acne on face twice a day after washing with acne soap 03/06/17   Rosiland Oz, MD  DIFFERIN 0.1 % cream Dispense Brand Name. Apply to acne at night after washing face with acne soap 07/21/16   Rosiland Oz, MD  fluticasone [facility]) 50 MCG/ACT nasal spray INHALE 2 SPRAYS IN EACH NOSTRIL ONCE DAILY. 11/20/16   Rosiland Oz, MD   medroxyPROGESTERone Acetate 150 MG/ML SUSY 150 mg For office injection every 12 weeks 02/05/22   Cyril Mourning A, NP  ondansetron (ZOFRAN-ODT) 4 MG disintegrating tablet Take 1 tablet (4 mg total) by mouth every 8 (eight) hours as needed for nausea or vomiting. 09/08/22   Jeanelle Malling, PA  RETIN-A 0.025 % cream SMARTSIG:Sparingly Topical Every Night 11/19/20   [provider]      Allergies    Patient has no known allergies.    Review of Systems   Review of Systems  Musculoskeletal:        Left shoulder pain  All other systems reviewed and are negative.   Physical Exam Updated Vital Signs BP 136/67   Pulse 67   Temp 98.2 F (36.8 C) (Oral)   Resp 16   Ht 5\' 2"  (1.575 m)   Wt 101 kg   LMP  (Exact Date)   SpO2 100%   BMI 40.73 kg/m  Physical Exam Vitals and nursing note reviewed.  Constitutional:      Appearance: Normal appearance.  HENT:     Head: Normocephalic and atraumatic.     Nose: Nose normal.     Mouth/Throat:     Mouth: Mucous membranes are moist.  Eyes:     Extraocular Movements: Extraocular movements intact.     Conjunctiva/sclera: Conjunctivae normal.     Pupils: Pupils are equal, round, and reactive to light.  Cardiovascular:     Rate and  Rhythm: Normal rate and regular rhythm.     Pulses: Normal pulses.     Heart sounds: Normal heart sounds. No murmur heard.    No gallop.  Pulmonary:     Effort: No respiratory distress.     Breath sounds: Normal breath sounds. No stridor. No wheezing or rales.  Musculoskeletal:        General: Normal range of motion.     Cervical back: Normal range of motion and neck supple.     Comments: Tenderness palpation noted over the left shoulder diffusely and along the left trapezius muscle, no midline cervical spine tenderness, nontender palpation over the left chest wall, nontender palpation over the left elbow, wrist, hand, radial pulses 2+ distally, radial, ulnar, median, axillary nerve function intact distally,  limited active range of motion secondary to pain, full passive range of motion, no obvious deformity or bruising, no skin breakdown or ulceration, no lacerations or abrasions, no overlying erythema, warmth, edema  Skin:    General: Skin is warm and dry.  Neurological:     General: No focal deficit present.     Mental Status: She is alert and oriented to person, place, and time. Mental status is at baseline.  Psychiatric:        Mood and Affect: Mood normal.        Behavior: Behavior normal.        Thought Content: Thought content normal.        Judgment: Judgment normal.     ED Results / Procedures / Treatments   Labs (all labs ordered are listed, but only abnormal results are displayed) Labs Reviewed - No data to display  EKG None  Radiology DG Shoulder Left Result Date: 04/15/2023 CLINICAL DATA:  Left shoulder pain and limited range of motion after feeling a pop while washing her car yesterday. EXAM: LEFT SHOULDER - 2+ VIEW COMPARISON:  None Available. FINDINGS: There is no evidence of fracture or dislocation. There is no evidence of arthropathy or other focal bone abnormality. Soft tissues are unremarkable. IMPRESSION: Negative. Electronically Signed   By: Obie Dredge M.D.   On: 04/15/2023 18:27    Procedures Procedures    Medications Ordered in ED Medications  naproxen (NAPROSYN) tablet 500 mg (has no administration in time range)  methocarbamol (ROBAXIN) tablet 750 mg (has no administration in time range)    ED Course/ Medical Decision Making/ A&P                                 Medical Decision Making Patient is doing well at this time and is stable for discharge home.  Discussed with patient that symptoms are most consistent with muscle strain at this time.  Will treat her symptomatically on an outpatient basis and recommend close follow-up with orthopedics for any continued symptoms.  She is neurovascularly intact distally.  X-rays demonstrate no signs of acute  osseous injury or lesions.  Patient has no tenderness over the cervical, thoracic, lumbar spine.  She had no direct falls or blunt trauma.  She has no chest pain, shortness of breath and pain is reproducible with palpation.  Do not suspect underlying cardiac or pulmonary etiology of her symptoms.  Will provide shoulder sling for comfort though patient was educated on range of motion exercises to prevent frozen shoulder.  Strict return precautions were discussed for any new or worsening symptoms.  Patient voiced understanding and had no additional  questions.  X-ray of the left shoulder was independently reviewed by myself demonstrated no signs of acute osseous injury or lesions  Amount and/or Complexity of Data Reviewed Radiology: ordered.  Risk Prescription drug management.           Final Clinical Impression(s) / ED Diagnoses Final diagnoses:  None    Rx / DC Orders ED Discharge Orders     None         Kathlen Mody 04/15/23 2022    Bethann Berkshire, MD 04/17/23 1341

## 2023-04-15 NOTE — ED Triage Notes (Signed)
 Pt arrived via POV c/o left shoulder pain and possible injury that began yesterday after Pt reports she heard a :pop" while washing her car. Pt has limited mobility in LUE.

## 2023-04-15 NOTE — Discharge Instructions (Signed)
 Please follow-up closely with orthopedics for any continued symptoms.  Return to emergency department immediately for any new or worsening symptoms.

## 2023-04-15 NOTE — ED Notes (Signed)
 Patient discharged. Provider spoke to patient. Paperwork given to patient and reviewed. Pt verbalized understanding. VSS. A+Ox4. Patient ambulated out of the ER with steady, independent gait. No iv in place.

## 2023-04-16 ENCOUNTER — Ambulatory Visit: Admitting: Obstetrics and Gynecology

## 2023-04-16 VITALS — BP 108/75 | HR 71 | Ht 61.0 in | Wt 220.0 lb

## 2023-04-16 DIAGNOSIS — Z01419 Encounter for gynecological examination (general) (routine) without abnormal findings: Secondary | ICD-10-CM | POA: Diagnosis not present

## 2023-04-16 DIAGNOSIS — L732 Hidradenitis suppurativa: Secondary | ICD-10-CM | POA: Diagnosis not present

## 2023-04-16 DIAGNOSIS — Z3202 Encounter for pregnancy test, result negative: Secondary | ICD-10-CM

## 2023-04-16 DIAGNOSIS — N926 Irregular menstruation, unspecified: Secondary | ICD-10-CM

## 2023-04-16 LAB — POCT URINE PREGNANCY: Preg Test, Ur: NEGATIVE

## 2023-04-16 MED ORDER — MEDROXYPROGESTERONE ACETATE 10 MG PO TABS: 10.0000 mg | ORAL_TABLET | Freq: Every day | ORAL | 0 refills | Status: AC

## 2023-04-16 MED ORDER — CLINDAMYCIN PHOSPHATE 1 % EX GEL
Freq: Two times a day (BID) | CUTANEOUS | 2 refills | Status: AC
Start: 2023-04-16 — End: ?

## 2023-04-16 NOTE — Progress Notes (Signed)
 ANNUAL EXAM Patient name: Allison Key MRN [id number]  Date of birth: Oct 28, 2000 Chief Complaint:   Gynecologic Exam (Stop depo Dec 2024, had period 1-14 again 02-21-23)  History of Present Illness:   Allison Key is a 23 y.o. G0P0000  female being seen today for a routine annual exam.  Current complaints: been off depo since October, has been on it since 2015 did not have period Oct, nov, did have cycle December, and twice in January. No period in February yet. Has not been sexually active 2021 No vaginal or pelvic complaints. Has HS, typically resolve or drain on own.   Patient's last menstrual period was 02/10/2023.   Upstream - 04/16/23 1451       Pregnancy Intention Screening   Does the patient want to become pregnant in the next year? No    Does the patient's partner want to become pregnant in the next year? No    Would the patient like to discuss contraceptive options today? Yes      Contraception Wrap Up   Current Method Oral Contraceptive    End Method Oral Contraceptive    Contraception Counseling Provided Yes            The pregnancy intention screening data noted above was reviewed. Potential methods of contraception were discussed. The patient elected to proceed with Oral Contraceptive.   Last pap 2024. Results were: NILM w/ HRHPV negative. H/O abnormal pap: no Last mammogram: n/a. Results were: N/A. Family h/o breast cancer: no      04/16/2023    2:44 PM 02/05/2022    3:38 PM 12/19/2020    8:50 AM 09/26/2020    4:04 PM 10/26/2019    1:10 PM  Depression screen PHQ 2/9  Decreased Interest 1 1 0 0 2  Down, Depressed, Hopeless 0 0 0 0 2  PHQ - 2 Score 1 1 0 0 4  Altered sleeping  0  0 2  Tired, decreased energy 1 1  3 1   Change in appetite 1 0  2 0  Feeling bad or failure about yourself  0 0  0 0  Trouble concentrating 0 1  0 1  Moving slowly or fidgety/restless 0 0  0 1  Suicidal thoughts 0 0  0   PHQ-9 Score  3  5 9         04/16/2023    2:44  PM 02/05/2022    3:38 PM 09/26/2020    4:04 PM 01/10/2019   12:03 PM  GAD 7 : Generalized Anxiety Score  Nervous, Anxious, on Edge 0 1 0 1  Control/stop worrying 0 0 0 2  Worry too much - different things 0 1 0 2  Trouble relaxing 0 0 0 2  Restless 0 0 3 2  Easily annoyed or irritable 0 1 0 3  Afraid - awful might happen 0 0 0 1  Total GAD 7 Score 0 3 3 13      Review of Systems:   Pertinent items are noted in HPI Denies any headaches, blurred vision, fatigue, shortness of breath, chest pain, abdominal pain, abnormal vaginal discharge/itching/odor/irritation, problems with periods, bowel movements, urination, or intercourse unless otherwise stated above. Pertinent History Reviewed:  Reviewed past medical,surgical, social and family history.  Reviewed problem list, medications and allergies. Physical Assessment:   Vitals:   04/16/23 1435  BP: 108/75  Pulse: 71  Weight: 220 lb (99.8 kg)  Height: 5\' 1"  (1.549 m)  Body mass index is  41.57 kg/m.        Physical Examination:   General appearance - well appearing, and in no distress  Mental status - alert, oriented   Psych:  She has a normal mood and affect  Skin - warm and dry, normal color  Chest - effort normal, all lung fields clear to auscultation bilaterally  Heart - normal rate and regular rhythm  Neck:  midline trachea, no thyromegaly   Breasts - breasts appear normal, no suspicious masses, no skin or nipple changes or  axillary nodes  Abdomen - soft, nontender  Pelvic - VULVA: normal appearing vulva with no masses, tenderness or lesions  VAGINA: normal appearing vagina with normal color and discharge, no lesions  CERVIX: normal appearing cervix without discharge or lesions, no CMT   UTERUS: uterus is felt to be normal size, shape, consistency and nontender   ADNEXA: No adnexal masses or tenderness noted.    Extremities:  No swelling or varicosities noted  Chaperone present for exam  Results for orders placed or  performed in visit on 04/16/23 (from the past 24 hours)  POCT urine pregnancy   Collection Time: 04/16/23  3:00 PM  Result Value Ref Range   Preg Test, Ur Negative Negative    Assessment & Plan:  1. Encounter for annual routine gynecological examination Encouraged self breast exams UTD on pap  2. Pregnancy test negative (Primary)  - POCT urine pregnancy  3. Hidradenitis suppurativa  - clindamycin (CLEOCIN-T) 1 % gel; Apply topically 2 (two) times daily.  Dispense: 30 g; Refill: 2 - Ambulatory referral to Dermatology  4. Irregular menstrual cycle Has been on depo since 2015, discussed months to regulate, can trial provera challenge if cycle does not come. Discussed follow up if continues to skip months     Labs/procedures today:   Mammogram: @ 23yo, or sooner if problems   Orders Placed This Encounter  Procedures   Ambulatory referral to Dermatology   POCT urine pregnancy    Meds:  Meds ordered this encounter  Medications   medroxyPROGESTERone (PROVERA) 10 MG tablet    Sig: Take 1 tablet (10 mg total) by mouth daily. Use for ten days    Dispense:  10 tablet    Refill:  0   clindamycin (CLEOCIN-T) 1 % gel    Sig: Apply topically 2 (two) times daily.    Dispense:  30 g    Refill:  2    Follow-up: Return in about 1 year (around 04/15/2024) for Thayer Jew, FNP

## 2023-06-12 ENCOUNTER — Ambulatory Visit: Admitting: Family Medicine

## 2023-07-14 ENCOUNTER — Other Ambulatory Visit: Payer: Self-pay

## 2023-07-14 ENCOUNTER — Encounter (HOSPITAL_COMMUNITY): Payer: Self-pay

## 2023-07-14 ENCOUNTER — Emergency Department (HOSPITAL_COMMUNITY)
Admission: EM | Admit: 2023-07-14 | Discharge: 2023-07-14 | Disposition: A | Discharge status: Home / Self Care | Attending: Student | Admitting: Student

## 2023-07-14 DIAGNOSIS — A059 Bacterial foodborne intoxication, unspecified: Secondary | ICD-10-CM | POA: Diagnosis not present

## 2023-07-14 DIAGNOSIS — Z7982 Long term (current) use of aspirin: Secondary | ICD-10-CM | POA: Diagnosis not present

## 2023-07-14 DIAGNOSIS — J45909 Unspecified asthma, uncomplicated: Secondary | ICD-10-CM | POA: Diagnosis not present

## 2023-07-14 DIAGNOSIS — E876 Hypokalemia: Secondary | ICD-10-CM | POA: Diagnosis not present

## 2023-07-14 DIAGNOSIS — R1110 Vomiting, unspecified: Secondary | ICD-10-CM | POA: Diagnosis present

## 2023-07-14 DIAGNOSIS — K529 Noninfective gastroenteritis and colitis, unspecified: Secondary | ICD-10-CM | POA: Diagnosis not present

## 2023-07-14 LAB — CBC WITH DIFFERENTIAL/PLATELET
Abs Immature Granulocytes: 0.03 10*3/uL (ref 0.00–0.07)
Basophils Absolute: 0 10*3/uL (ref 0.0–0.1)
Basophils Relative: 0 %
Eosinophils Absolute: 0 10*3/uL (ref 0.0–0.5)
Eosinophils Relative: 0 %
HCT: 39.8 % (ref 36.0–46.0)
Hemoglobin: 13.8 g/dL (ref 12.0–15.0)
Immature Granulocytes: 0 %
Lymphocytes Relative: 12 %
Lymphs Abs: 1.1 10*3/uL (ref 0.7–4.0)
MCH: 30.4 pg (ref 26.0–34.0)
MCHC: 34.7 g/dL (ref 30.0–36.0)
MCV: 87.7 fL (ref 80.0–100.0)
Monocytes Absolute: 0.3 10*3/uL (ref 0.1–1.0)
Monocytes Relative: 3 %
Neutro Abs: 7.8 10*3/uL — ABNORMAL HIGH (ref 1.7–7.7)
Neutrophils Relative %: 85 %
Platelets: 302 10*3/uL (ref 150–400)
RBC: 4.54 MIL/uL (ref 3.87–5.11)
RDW: 12.1 % (ref 11.5–15.5)
WBC: 9.3 10*3/uL (ref 4.0–10.5)
nRBC: 0 % (ref 0.0–0.2)

## 2023-07-14 LAB — COMPREHENSIVE METABOLIC PANEL WITH GFR
ALT: 10 U/L (ref 0–44)
AST: 19 U/L (ref 15–41)
Albumin: 3.9 g/dL (ref 3.5–5.0)
Alkaline Phosphatase: 119 U/L (ref 38–126)
Anion gap: 12 (ref 5–15)
BUN: 8 mg/dL (ref 6–20)
CO2: 18 mmol/L — ABNORMAL LOW (ref 22–32)
Calcium: 9 mg/dL (ref 8.9–10.3)
Chloride: 108 mmol/L (ref 98–111)
Creatinine, Ser: 0.71 mg/dL (ref 0.44–1.00)
GFR, Estimated: >60 mL/min (ref >60)
Glucose, Bld: 117 mg/dL — ABNORMAL HIGH (ref 70–99)
Potassium: 3.3 mmol/L — ABNORMAL LOW (ref 3.5–5.1)
Sodium: 138 mmol/L (ref 135–145)
Total Bilirubin: 1 mg/dL (ref 0.0–1.2)
Total Protein: 7.5 g/dL (ref 6.5–8.1)

## 2023-07-14 LAB — URINALYSIS, ROUTINE W REFLEX MICROSCOPIC
Bilirubin Urine: NEGATIVE
Glucose, UA: NEGATIVE mg/dL
Ketones, ur: 5 mg/dL — AB
Nitrite: NEGATIVE
Protein, ur: NEGATIVE mg/dL
Specific Gravity, Urine: 1.012 (ref 1.005–1.030)
pH: 8 (ref 5.0–8.0)

## 2023-07-14 LAB — LIPASE, BLOOD: Lipase: 26 U/L (ref 11–51)

## 2023-07-14 LAB — PREGNANCY, URINE: Preg Test, Ur: NEGATIVE

## 2023-07-14 MED ORDER — ALUM & MAG HYDROXIDE-SIMETH 200-200-20 MG/5ML PO SUSP
30.0000 mL | Freq: Once | ORAL | Status: AC
  Administered 2023-07-14: 30 mL via ORAL
  Filled 2023-07-14: qty 30

## 2023-07-14 MED ORDER — ONDANSETRON 4 MG PO TBDP: 4.0000 mg | ORAL_TABLET | Freq: Three times a day (TID) | ORAL | 0 refills | Status: AC | PRN

## 2023-07-14 MED ORDER — FAMOTIDINE IN NACL 20-0.9 MG/50ML-% IV SOLN
20.0000 mg | Freq: Once | INTRAVENOUS | Status: AC
  Administered 2023-07-14: 20 mg via INTRAVENOUS
  Filled 2023-07-14: qty 50

## 2023-07-14 MED ORDER — LIDOCAINE VISCOUS HCL 2 % MT SOLN
15.0000 mL | Freq: Once | OROMUCOSAL | Status: AC
  Administered 2023-07-14: 15 mL via ORAL
  Filled 2023-07-14: qty 15

## 2023-07-14 MED ORDER — POTASSIUM CHLORIDE CRYS ER 20 MEQ PO TBCR
40.0000 meq | EXTENDED_RELEASE_TABLET | Freq: Once | ORAL | Status: DC
  Filled 2023-07-14: qty 4

## 2023-07-14 MED ORDER — FAMOTIDINE 20 MG PO TABS: 20.0000 mg | ORAL_TABLET | Freq: Two times a day (BID) | ORAL | 0 refills | Status: AC

## 2023-07-14 MED ORDER — MORPHINE SULFATE (PF) 4 MG/ML IV SOLN: 4.0000 mg | Freq: Once | INTRAVENOUS | Status: DC

## 2023-07-14 MED ORDER — MAGNESIUM OXIDE -MG SUPPLEMENT 400 (240 MG) MG PO TABS
800.0000 mg | ORAL_TABLET | Freq: Once | ORAL | Status: DC
  Filled 2023-07-14: qty 2

## 2023-07-14 MED ORDER — LACTATED RINGERS IV BOLUS
1000.0000 mL | Freq: Once | INTRAVENOUS | Status: AC
  Administered 2023-07-14: 1000 mL via INTRAVENOUS

## 2023-07-14 MED ORDER — DIPHENHYDRAMINE HCL 50 MG/ML IJ SOLN
25.0000 mg | Freq: Once | INTRAMUSCULAR | Status: AC
  Administered 2023-07-14: 25 mg via INTRAVENOUS
  Filled 2023-07-14: qty 1

## 2023-07-14 MED ORDER — PROCHLORPERAZINE EDISYLATE 10 MG/2ML IJ SOLN
10.0000 mg | Freq: Once | INTRAMUSCULAR | Status: AC
  Administered 2023-07-14: 10 mg via INTRAVENOUS
  Filled 2023-07-14: qty 2

## 2023-07-14 MED ORDER — ONDANSETRON HCL 4 MG/2ML IJ SOLN
4.0000 mg | Freq: Once | INTRAMUSCULAR | Status: AC
  Administered 2023-07-14: 4 mg via INTRAVENOUS
  Filled 2023-07-14: qty 2
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
## 2023-07-15 ENCOUNTER — Other Ambulatory Visit: Payer: Self-pay

## 2023-07-15 ENCOUNTER — Emergency Department (HOSPITAL_COMMUNITY)

## 2023-07-15 ENCOUNTER — Encounter (HOSPITAL_COMMUNITY): Payer: Self-pay

## 2023-07-15 ENCOUNTER — Emergency Department (HOSPITAL_COMMUNITY)
Admission: EM | Admit: 2023-07-15 | Discharge: 2023-07-15 | Disposition: A | Discharge status: Home / Self Care | Attending: Emergency Medicine | Admitting: Emergency Medicine

## 2023-07-15 DIAGNOSIS — R1084 Generalized abdominal pain: Secondary | ICD-10-CM | POA: Insufficient documentation

## 2023-07-15 DIAGNOSIS — R319 Hematuria, unspecified: Secondary | ICD-10-CM | POA: Insufficient documentation

## 2023-07-15 DIAGNOSIS — D72829 Elevated white blood cell count, unspecified: Secondary | ICD-10-CM | POA: Insufficient documentation

## 2023-07-15 DIAGNOSIS — R109 Unspecified abdominal pain: Secondary | ICD-10-CM | POA: Diagnosis not present

## 2023-07-15 LAB — CBC WITH DIFFERENTIAL/PLATELET
Abs Immature Granulocytes: 0.05 10*3/uL (ref 0.00–0.07)
Basophils Absolute: 0 10*3/uL (ref 0.0–0.1)
Basophils Relative: 0 %
Eosinophils Absolute: 0 10*3/uL (ref 0.0–0.5)
Eosinophils Relative: 0 %
HCT: 43.4 % (ref 36.0–46.0)
Hemoglobin: 15.1 g/dL — ABNORMAL HIGH (ref 12.0–15.0)
Immature Granulocytes: 0 %
Lymphocytes Relative: 12 %
Lymphs Abs: 1.7 10*3/uL (ref 0.7–4.0)
MCH: 30.3 pg (ref 26.0–34.0)
MCHC: 34.8 g/dL (ref 30.0–36.0)
MCV: 87 fL (ref 80.0–100.0)
Monocytes Absolute: 0.8 10*3/uL (ref 0.1–1.0)
Monocytes Relative: 6 %
Neutro Abs: 11.8 10*3/uL — ABNORMAL HIGH (ref 1.7–7.7)
Neutrophils Relative %: 82 %
Platelets: 371 10*3/uL (ref 150–400)
RBC: 4.99 MIL/uL (ref 3.87–5.11)
RDW: 12.1 % (ref 11.5–15.5)
WBC: 14.4 10*3/uL — ABNORMAL HIGH (ref 4.0–10.5)
nRBC: 0 % (ref 0.0–0.2)

## 2023-07-15 LAB — COMPREHENSIVE METABOLIC PANEL WITH GFR
ALT: 10 U/L (ref 0–44)
AST: 16 U/L (ref 15–41)
Albumin: 3.7 g/dL (ref 3.5–5.0)
Alkaline Phosphatase: 117 U/L (ref 38–126)
Anion gap: 11 (ref 5–15)
BUN: 8 mg/dL (ref 6–20)
CO2: 21 mmol/L — ABNORMAL LOW (ref 22–32)
Calcium: 9 mg/dL (ref 8.9–10.3)
Chloride: 102 mmol/L (ref 98–111)
Creatinine, Ser: 0.63 mg/dL (ref 0.44–1.00)
GFR, Estimated: >60 mL/min (ref >60)
Glucose, Bld: 94 mg/dL (ref 70–99)
Potassium: 3.6 mmol/L (ref 3.5–5.1)
Sodium: 134 mmol/L — ABNORMAL LOW (ref 135–145)
Total Bilirubin: 1.6 mg/dL — ABNORMAL HIGH (ref 0.0–1.2)
Total Protein: 7.5 g/dL (ref 6.5–8.1)

## 2023-07-15 LAB — URINALYSIS, ROUTINE W REFLEX MICROSCOPIC
Bilirubin Urine: NEGATIVE
Glucose, UA: NEGATIVE mg/dL
Ketones, ur: 20 mg/dL — AB
Nitrite: NEGATIVE
Protein, ur: 30 mg/dL — AB
Specific Gravity, Urine: 1.02 (ref 1.005–1.030)
pH: 7 (ref 5.0–8.0)

## 2023-07-15 LAB — URINE CULTURE

## 2023-07-15 LAB — HCG, SERUM, QUALITATIVE: Preg, Serum: NEGATIVE

## 2023-07-15 LAB — LIPASE, BLOOD: Lipase: 31 U/L (ref 11–51)

## 2023-07-15 MED ORDER — METOCLOPRAMIDE HCL 5 MG/ML IJ SOLN
10.0000 mg | Freq: Once | INTRAMUSCULAR | Status: AC
  Administered 2023-07-15: 10 mg via INTRAVENOUS
  Filled 2023-07-15: qty 2

## 2023-07-15 MED ORDER — IOHEXOL 300 MG/ML  SOLN
100.0000 mL | Freq: Once | INTRAMUSCULAR | Status: AC | PRN
  Administered 2023-07-15: 100 mL via INTRAVENOUS

## 2023-07-15 MED ORDER — MORPHINE SULFATE (PF) 4 MG/ML IV SOLN
4.0000 mg | Freq: Once | INTRAVENOUS | Status: AC
  Administered 2023-07-15: 4 mg via INTRAVENOUS
  Filled 2023-07-15: qty 1

## 2023-07-15 MED ORDER — SODIUM CHLORIDE 0.9 % IV BOLUS
1000.0000 mL | Freq: Once | INTRAVENOUS | Status: AC
  Administered 2023-07-15: 1000 mL via INTRAVENOUS

## 2023-07-15 NOTE — ED Triage Notes (Signed)
 Pt arrived via POV c/o recurrent abdominal pain and emesis. Pt seen here yesterday and prescribed Zofran , but Pt reports it is not helping. Pt reports phenergan helped but she does not have anymore at home.

## 2023-07-15 NOTE — Discharge Instructions (Signed)
 Evaluation for your abdominal pain, nausea vomiting diarrhea was overall reassuring.  Suspect this is ongoing symptoms from recent foodborne illness.  Please continue conservative treatment at home with assertive hydration and you can take Zofran  as needed.  Also recommend Tylenol  ibuprofen  for your abdominal pain.  Please follow-up your PCP if your symptoms persist.  If you have persistent nausea vomiting, worsening abdominal pain, fever or any other concerning symptom please return to the ED for further evaluation.

## 2023-07-15 NOTE — ED Notes (Signed)
 Patient tolerated 12 oz of water, no vomiting noted.

## 2023-07-15 NOTE — ED Provider Notes (Signed)
 [place] EMERGENCY DEPARTMENT AT [facility] Provider Note   CSN: [id number] Arrival date & time: 07/15/23  1513     Patient presents with: Emesis  HPI Allison Key is a 23 y.o. female presenting for nausea vomiting diarrhea and abdominal pain.  Has been going on for 2 days.  States that 2 days ago she had some fish that was likely undercooked.  Shortly after she started to have nausea vomiting and diarrhea.  Other members of the family had similar symptoms.  She was evaluated here in symptoms were improved after treatment and was sent home.  She presents today with persistent of her symptoms and abdominal pain is worse.  Denies fever.  Pain is all over the abdomen.  Denies urinary symptoms or abnormal vaginal bleeding or discharge.    Emesis      Prior to Admission medications   Medication Sig Start Date End Date Taking? Authorizing Provider  albuterol  (VENTOLIN  HFA) 108 (90 Base) MCG/ACT inhaler Inhale 2 puffs into the lungs every 4 (four) hours as needed for wheezing or shortness of breath. 10/31/21   Ballard Bongo, MD  aspirin-acetaminophen -caffeine (EXCEDRIN MIGRAINE) 250-250-65 MG tablet Take 2 tablets by mouth every 6 (six) hours as needed for headache.    [provider]  clindamycin  (CLEOCIN -T) 1 % gel Apply topically 2 (two) times daily. 04/16/23   Zelma Hidden, FNP  clindamycin -benzoyl peroxide (BENZACLIN WITH PUMP) gel Dispense generic brand. Apply to acne on face twice a day after washing with acne soap 03/06/17   German Koller, MD  DIFFERIN  0.1 % cream Dispense Brand Name. Apply to acne at night after washing face with acne soap 07/21/16   German Koller, MD  famotidine (PEPCID) 20 MG tablet Take 1 tablet (20 mg total) by mouth 2 (two) times daily. 07/14/23   Kommor, Madison, MD  fluticasone  (FLONASE ) 50 MCG/ACT nasal spray INHALE 2 SPRAYS IN EACH NOSTRIL ONCE DAILY. 11/20/16   German Koller, MD  medroxyPROGESTERone  (PROVERA )  10 MG tablet Take 1 tablet (10 mg total) by mouth daily. Use for ten days 04/16/23   Zelma Hidden, FNP  ondansetron  (ZOFRAN -ODT) 4 MG disintegrating tablet Take 1 tablet (4 mg total) by mouth every 8 (eight) hours as needed for nausea or vomiting. 07/14/23   Kommor, Alyse July, MD    Allergies: Patient has no known allergies.    Review of Systems  Gastrointestinal:  Positive for vomiting.    Updated Vital Signs BP 110/63 (BP Location: Left Arm)   Pulse 71   Temp 98.5 F (36.9 C) (Oral)   Resp 17   Ht 5' 1 (1.549 m)   Wt 99.8 kg   SpO2 98%   BMI 41.57 kg/m   Physical Exam Vitals and nursing note reviewed.  HENT:     Head: Normocephalic and atraumatic.     Mouth/Throat:     Mouth: Mucous membranes are moist.   Eyes:     General:        Right eye: No discharge.        Left eye: No discharge.     Conjunctiva/sclera: Conjunctivae normal.    Cardiovascular:     Rate and Rhythm: Normal rate and regular rhythm.     Pulses: Normal pulses.     Heart sounds: Normal heart sounds.  Pulmonary:     Effort: Pulmonary effort is normal.     Breath sounds: Normal breath sounds.  Abdominal:     General:  Abdomen is flat.     Palpations: Abdomen is soft.     Tenderness: There is generalized abdominal tenderness.   Skin:    General: Skin is warm and dry.   Neurological:     General: No focal deficit present.   Psychiatric:        Mood and Affect: Mood normal.     (all labs ordered are listed, but only abnormal results are displayed) Labs Reviewed  CBC WITH DIFFERENTIAL/PLATELET - Abnormal; Notable for the following components:      Result Value   WBC 14.4 (*)    Hemoglobin 15.1 (*)    Neutro Abs 11.8 (*)    All other components within normal limits  COMPREHENSIVE METABOLIC PANEL WITH GFR - Abnormal; Notable for the following components:   Sodium 134 (*)    CO2 21 (*)    Total Bilirubin 1.6 (*)    All other components within normal limits  URINALYSIS, ROUTINE W REFLEX  MICROSCOPIC - Abnormal; Notable for the following components:   Hgb urine dipstick MODERATE (*)    Ketones, ur 20 (*)    Protein, ur 30 (*)    Leukocytes,Ua SMALL (*)    Bacteria, UA FEW (*)    All other components within normal limits  LIPASE, BLOOD  HCG, SERUM, QUALITATIVE    EKG: None  Radiology: CT ABDOMEN PELVIS W CONTRAST Result Date: 07/15/2023 CLINICAL DATA:  Abdominal pain, emesis EXAM: CT ABDOMEN AND PELVIS WITH CONTRAST TECHNIQUE: Multidetector CT imaging of the abdomen and pelvis was performed using the standard protocol following bolus administration of intravenous contrast. RADIATION DOSE REDUCTION: This exam was performed according to the departmental dose-optimization program which includes automated exposure control, adjustment of the mA and/or kV according to patient size and/or use of iterative reconstruction technique. CONTRAST:  OMNIPAQUE IOHEXOL 300 MG/ML  SOLN COMPARISON:  03/22/2017 FINDINGS: Lower chest: No acute pleural or parenchymal lung disease. Hepatobiliary: No focal liver abnormality is seen. No gallstones, gallbladder wall thickening, or biliary dilatation. Pancreas: Unremarkable. No pancreatic ductal dilatation or surrounding inflammatory changes. Spleen: Normal in size without focal abnormality. Adrenals/Urinary Tract: Adrenal glands are unremarkable. Kidneys are normal, without renal calculi, focal lesion, or hydronephrosis. Bladder is unremarkable. Stomach/Bowel: No bowel obstruction or ileus. Normal appendix right lower quadrant. No bowel wall thickening or inflammatory change. Vascular/Lymphatic: No significant vascular findings are present. No enlarged abdominal or pelvic lymph nodes. Reproductive: Uterus and bilateral adnexa are unremarkable. Other: No free fluid or free intraperitoneal gas. No abdominal wall hernia. Musculoskeletal: No acute or destructive bony abnormalities. Reconstructed images demonstrate no additional findings. IMPRESSION: 1. No  acute intra-abdominal or intrapelvic process. Normal appendix. Electronically Signed   By: Bobbye Burrow M.D.   On: 07/15/2023 19:19     Procedures   Medications Ordered in the ED  metoCLOPramide (REGLAN) injection 10 mg (10 mg Intravenous Given 07/15/23 1829)  sodium chloride  0.9 % bolus 1,000 mL (0 mLs Intravenous Stopped 07/15/23 1934)  morphine (PF) 4 MG/ML injection 4 mg (4 mg Intravenous Given 07/15/23 1829)  iohexol (OMNIPAQUE) 300 MG/ML solution 100 mL (100 mLs Intravenous Contrast Given 07/15/23 1855)                                    Medical Decision Making Amount and/or Complexity of Data Reviewed Labs: ordered. Radiology: ordered.  Risk Prescription drug management.   Initial Impression and Ddx 23 year old well-appearing female  presenting for abdominal pain.  Exam notable for generalized abdominal tenderness.  DDx includes acute cholecystitis, appendicitis, kidney stone, electrolyte derangement, viral gastroenteritis, foodborne illness, other. Patient PMH that increases complexity of ED encounter:  none  Interpretation of Diagnostics - I independent reviewed and interpreted the labs as followed: Leukocytosis, hematuria and a few bacteria but a small pyuria.  - I independently visualized the following imaging with scope of interpretation limited to determining acute life threatening conditions related to emergency care: CT ab/pelvis, which revealed no acute findings  Patient Reassessment and Ultimate Disposition/Management On reassessment, patient was feeling better.  Suspect this is ongoing symptoms related to likely recent foodborne illness.  Workup does not suggest sepsis.  Fluid challenged with no complication.  No witnessed vomiting during this encounter.  Advised continued supportive treatment at home.  Patient has already been prescribed Zofran  for home.  Advised her to follow-up with her PCP.  Discussed return precautions.  Discharged in good condition.  Patient  management required discussion with the following services or consulting groups:  None  Complexity of Problems Addressed Acute complicated illness or Injury  Additional Data Reviewed and Analyzed Further history obtained from: Further history from spouse/family member, Past medical history and medications listed in the EMR, and Prior ED visit notes  Patient Encounter Risk Assessment Consideration of hospitalization      Final diagnoses:  Abdominal pain, unspecified abdominal location    ED Discharge Orders     None          Cleo Santucci K, PA-C 07/15/23 2013    Cheyenne Cotta, MD 07/17/23 1119

## 2023-07-17 ENCOUNTER — Other Ambulatory Visit: Payer: Self-pay

## 2023-07-17 ENCOUNTER — Emergency Department (HOSPITAL_COMMUNITY)
Admission: EM | Admit: 2023-07-17 | Discharge: 2023-07-17 | Disposition: A | Discharge status: Home / Self Care | Attending: Student | Admitting: Student

## 2023-07-17 ENCOUNTER — Encounter (HOSPITAL_COMMUNITY): Payer: Self-pay

## 2023-07-17 DIAGNOSIS — Z7951 Long term (current) use of inhaled steroids: Secondary | ICD-10-CM | POA: Diagnosis not present

## 2023-07-17 DIAGNOSIS — R197 Diarrhea, unspecified: Secondary | ICD-10-CM | POA: Insufficient documentation

## 2023-07-17 DIAGNOSIS — R1084 Generalized abdominal pain: Secondary | ICD-10-CM | POA: Insufficient documentation

## 2023-07-17 DIAGNOSIS — R112 Nausea with vomiting, unspecified: Secondary | ICD-10-CM | POA: Insufficient documentation

## 2023-07-17 DIAGNOSIS — Z7982 Long term (current) use of aspirin: Secondary | ICD-10-CM | POA: Diagnosis not present

## 2023-07-17 DIAGNOSIS — J45909 Unspecified asthma, uncomplicated: Secondary | ICD-10-CM | POA: Insufficient documentation

## 2023-07-17 LAB — COMPREHENSIVE METABOLIC PANEL WITH GFR
ALT: 13 U/L (ref 0–44)
AST: 15 U/L (ref 15–41)
Albumin: 3.8 g/dL (ref 3.5–5.0)
Alkaline Phosphatase: 120 U/L (ref 38–126)
Anion gap: 12 (ref 5–15)
BUN: 11 mg/dL (ref 6–20)
CO2: 20 mmol/L — ABNORMAL LOW (ref 22–32)
Calcium: 8.8 mg/dL — ABNORMAL LOW (ref 8.9–10.3)
Chloride: 103 mmol/L (ref 98–111)
Creatinine, Ser: 0.63 mg/dL (ref 0.44–1.00)
GFR, Estimated: >60 mL/min (ref >60)
Glucose, Bld: 76 mg/dL (ref 70–99)
Potassium: 3.3 mmol/L — ABNORMAL LOW (ref 3.5–5.1)
Sodium: 135 mmol/L (ref 135–145)
Total Bilirubin: 2.1 mg/dL — ABNORMAL HIGH (ref 0.0–1.2)
Total Protein: 7.5 g/dL (ref 6.5–8.1)

## 2023-07-17 LAB — URINALYSIS, ROUTINE W REFLEX MICROSCOPIC
Bacteria, UA: NONE SEEN
Bilirubin Urine: NEGATIVE
Glucose, UA: NEGATIVE mg/dL
Ketones, ur: 80 mg/dL — AB
Nitrite: NEGATIVE
Protein, ur: 100 mg/dL — AB
Specific Gravity, Urine: 1.036 — ABNORMAL HIGH (ref 1.005–1.030)
WBC, UA: >50 WBC/hpf (ref 0–5)
pH: 6 (ref 5.0–8.0)

## 2023-07-17 LAB — CBC
HCT: 45.5 % (ref 36.0–46.0)
Hemoglobin: 15.6 g/dL — ABNORMAL HIGH (ref 12.0–15.0)
MCH: 29.4 pg (ref 26.0–34.0)
MCHC: 34.3 g/dL (ref 30.0–36.0)
MCV: 85.8 fL (ref 80.0–100.0)
Platelets: 308 10*3/uL (ref 150–400)
RBC: 5.3 MIL/uL — ABNORMAL HIGH (ref 3.87–5.11)
RDW: 11.9 % (ref 11.5–15.5)
WBC: 11 10*3/uL — ABNORMAL HIGH (ref 4.0–10.5)
nRBC: 0 % (ref 0.0–0.2)

## 2023-07-17 LAB — LIPASE, BLOOD: Lipase: 30 U/L (ref 11–51)

## 2023-07-17 MED ORDER — PROMETHAZINE HCL 12.5 MG PO TABS: 25.0000 mg | ORAL_TABLET | Freq: Four times a day (QID) | ORAL | 0 refills | Status: AC | PRN

## 2023-07-17 MED ORDER — PANTOPRAZOLE SODIUM 40 MG IV SOLR
40.0000 mg | Freq: Once | INTRAVENOUS | Status: AC
Start: 2023-07-17 — End: 2023-07-17
  Administered 2023-07-17: 40 mg via INTRAVENOUS
  Filled 2023-07-17: qty 10

## 2023-07-17 MED ORDER — ONDANSETRON 4 MG PO TBDP
4.0000 mg | ORAL_TABLET | Freq: Once | ORAL | Status: AC
  Administered 2023-07-17: 4 mg via ORAL
  Filled 2023-07-17: qty 1

## 2023-07-17 MED ORDER — LACTATED RINGERS IV BOLUS
1000.0000 mL | Freq: Once | INTRAVENOUS | Status: AC
  Administered 2023-07-17: 1000 mL via INTRAVENOUS

## 2023-07-17 MED ORDER — ONDANSETRON 4 MG PO TBDP: 4.0000 mg | ORAL_TABLET | Freq: Once | ORAL | Status: DC | PRN

## 2023-07-17 MED ORDER — DICYCLOMINE HCL 10 MG PO CAPS
10.0000 mg | ORAL_CAPSULE | Freq: Once | ORAL | Status: AC
  Administered 2023-07-17: 10 mg via ORAL
  Filled 2023-07-17: qty 1

## 2023-07-17 MED ORDER — ONDANSETRON HCL 4 MG/2ML IJ SOLN: 4.0000 mg | Freq: Once | INTRAMUSCULAR | Status: DC

## 2023-07-17 MED ORDER — DROPERIDOL 2.5 MG/ML IJ SOLN
1.2500 mg | Freq: Once | INTRAMUSCULAR | Status: AC
Start: 2023-07-17 — End: 2023-07-17
  Administered 2023-07-17: 1.25 mg via INTRAVENOUS
  Filled 2023-07-17: qty 2

## 2023-07-17 MED ORDER — POTASSIUM CHLORIDE CRYS ER 20 MEQ PO TBCR
40.0000 meq | EXTENDED_RELEASE_TABLET | Freq: Once | ORAL | Status: AC
  Administered 2023-07-17: 40 meq via ORAL
  Filled 2023-07-17: qty 2

## 2023-07-17 NOTE — Discharge Instructions (Addendum)
 Pleasure taking care of you today.  You were evaluated for nausea vomiting and diarrhea that has been continuous for the past several days.  You were given nausea medicine and fluids in the ER.  Your labs look overall reassuring aside from some mild low potassium.  You had a normal CT scan several days ago, so do not need any further imaging.  We are prescribing promethazine at home for nausea since the Zofran  does not seem to be helping.  Drink plenty of fluids you can eat a bland diet such as applesauce, dry toast, white rice, and probiotic you but can also help.  Please follow closely with PCP.  If your symptoms are continuing you should follow-up with GI.  If you have new or worsening symptoms come back to the ER right away.

## 2023-07-17 NOTE — ED Triage Notes (Signed)
 Pt presents with on going N/V/D since 6/17. Pt states she ate some bad fish the night before symptoms started. Pt was evaluated twice in the ED, but has still been unable to keep down any meds or food.

## 2023-07-17 NOTE — ED Provider Notes (Cosign Needed Addendum)
 [place] EMERGENCY DEPARTMENT AT [facility] Provider Note   CSN: [id number] Arrival date & time: 07/17/23  1403     Patient presents with: Abdominal Pain   Allison Key is a 23 y.o. female.  She has PMH of asthma, depression.  She presents ER complaining of nausea vomiting diarrhea with abdominal pain x 4 days.  Still may be keeping down at home.  She has been in the ED twice and had labs as well as a CT scan 2 days ago.  Everything was reassuring but despite Zofran  at home she still able to keep anything down.  Her mother states she has some more improvement at home with Phenergan but they have leftover and requesting prescription for this as well.  Mother is specifically worried because she has not been eating.  She was able to keep a couple of grapes down but no other foods    Abdominal Pain      Prior to Admission medications   Medication Sig Start Date End Date Taking? Authorizing Provider  promethazine (PHENERGAN) 12.5 MG tablet Take 2 tablets (25 mg total) by mouth every 6 (six) hours as needed for nausea or vomiting. 07/17/23  Yes Vonzella Althaus A, PA-C  albuterol  (VENTOLIN  HFA) 108 (90 Base) MCG/ACT inhaler Inhale 2 puffs into the lungs every 4 (four) hours as needed for wheezing or shortness of breath. 10/31/21   Ballard Bongo, MD  aspirin-acetaminophen -caffeine (EXCEDRIN MIGRAINE) 250-250-65 MG tablet Take 2 tablets by mouth every 6 (six) hours as needed for headache.    [provider]  clindamycin  (CLEOCIN -T) 1 % gel Apply topically 2 (two) times daily. 04/16/23   Zelma Hidden, FNP  clindamycin -benzoyl peroxide (BENZACLIN WITH PUMP) gel Dispense generic brand. Apply to acne on face twice a day after washing with acne soap 03/06/17   German Koller, MD  DIFFERIN  0.1 % cream Dispense Brand Name. Apply to acne at night after washing face with acne soap 07/21/16   German Koller, MD  famotidine (PEPCID) 20 MG tablet Take 1 tablet (20 mg  total) by mouth 2 (two) times daily. 07/14/23   Kommor, Madison, MD  fluticasone  (FLONASE ) 50 MCG/ACT nasal spray INHALE 2 SPRAYS IN EACH NOSTRIL ONCE DAILY. 11/20/16   German Koller, MD  medroxyPROGESTERone  (PROVERA ) 10 MG tablet Take 1 tablet (10 mg total) by mouth daily. Use for ten days 04/16/23   Zelma Hidden, FNP  ondansetron  (ZOFRAN -ODT) 4 MG disintegrating tablet Take 1 tablet (4 mg total) by mouth every 8 (eight) hours as needed for nausea or vomiting. 07/14/23   Kommor, Alyse July, MD    Allergies: Patient has no known allergies.    Review of Systems  Gastrointestinal:  Positive for abdominal pain.    Updated Vital Signs BP 111/70   Pulse 80   Temp 98.1 F (36.7 C) (Oral)   Resp 17   Ht 5' 1 (1.549 m)   Wt 99.8 kg   LMP 06/16/2023   SpO2 100%   BMI 41.57 kg/m   Physical Exam Vitals and nursing note reviewed.  Constitutional:      General: She is not in acute distress.    Appearance: She is well-developed.  HENT:     Head: Normocephalic and atraumatic.   Eyes:     Conjunctiva/sclera: Conjunctivae normal.    Cardiovascular:     Rate and Rhythm: Normal rate and regular rhythm.     Heart sounds: No murmur heard. Pulmonary:  Effort: Pulmonary effort is normal. No respiratory distress.     Breath sounds: Normal breath sounds.  Abdominal:     Palpations: Abdomen is soft.     Tenderness: There is generalized abdominal tenderness. There is no guarding or rebound. Negative signs include Murphy's sign and McBurney's sign.   Musculoskeletal:        General: No swelling.     Cervical back: Neck supple.   Skin:    General: Skin is warm and dry.     Capillary Refill: Capillary refill takes less than 2 seconds.   Neurological:     Mental Status: She is alert.   Psychiatric:        Mood and Affect: Mood normal.     (all labs ordered are listed, but only abnormal results are displayed) Labs Reviewed  COMPREHENSIVE METABOLIC PANEL WITH GFR - Abnormal;  Notable for the following components:      Result Value   Potassium 3.3 (*)    CO2 20 (*)    Calcium 8.8 (*)    Total Bilirubin 2.1 (*)    All other components within normal limits  CBC - Abnormal; Notable for the following components:   WBC 11.0 (*)    RBC 5.30 (*)    Hemoglobin 15.6 (*)    All other components within normal limits  LIPASE, BLOOD  URINALYSIS, ROUTINE W REFLEX MICROSCOPIC    EKG: None  Radiology: CT ABDOMEN PELVIS W CONTRAST Result Date: 07/15/2023 CLINICAL DATA:  Abdominal pain, emesis EXAM: CT ABDOMEN AND PELVIS WITH CONTRAST TECHNIQUE: Multidetector CT imaging of the abdomen and pelvis was performed using the standard protocol following bolus administration of intravenous contrast. RADIATION DOSE REDUCTION: This exam was performed according to the departmental dose-optimization program which includes automated exposure control, adjustment of the mA and/or kV according to patient size and/or use of iterative reconstruction technique. CONTRAST:  OMNIPAQUE IOHEXOL 300 MG/ML  SOLN COMPARISON:  03/22/2017 FINDINGS: Lower chest: No acute pleural or parenchymal lung disease. Hepatobiliary: No focal liver abnormality is seen. No gallstones, gallbladder wall thickening, or biliary dilatation. Pancreas: Unremarkable. No pancreatic ductal dilatation or surrounding inflammatory changes. Spleen: Normal in size without focal abnormality. Adrenals/Urinary Tract: Adrenal glands are unremarkable. Kidneys are normal, without renal calculi, focal lesion, or hydronephrosis. Bladder is unremarkable. Stomach/Bowel: No bowel obstruction or ileus. Normal appendix right lower quadrant. No bowel wall thickening or inflammatory change. Vascular/Lymphatic: No significant vascular findings are present. No enlarged abdominal or pelvic lymph nodes. Reproductive: Uterus and bilateral adnexa are unremarkable. Other: No free fluid or free intraperitoneal gas. No abdominal wall hernia. Musculoskeletal:  No acute or destructive bony abnormalities. Reconstructed images demonstrate no additional findings. IMPRESSION: 1. No acute intra-abdominal or intrapelvic process. Normal appendix. Electronically Signed   By: Bobbye Burrow M.D.   On: 07/15/2023 19:19     Procedures   Medications Ordered in the ED  potassium chloride SA (KLOR-CON M) CR tablet 40 mEq (has no administration in time range)  ondansetron  (ZOFRAN -ODT) disintegrating tablet 4 mg (4 mg Oral Given 07/17/23 1452)  lactated ringers bolus 1,000 mL (1,000 mLs Intravenous New Bag/Given 07/17/23 1719)  droperidol (INAPSINE) 2.5 MG/ML injection 1.25 mg (1.25 mg Intravenous Given 07/17/23 1718)  dicyclomine (BENTYL) capsule 10 mg (10 mg Oral Given 07/17/23 1718)  pantoprazole (PROTONIX) injection 40 mg (40 mg Intravenous Given 07/17/23 1719)  Medical Decision Making This patient presents to the ED for concern of nausea vomiting and diarrhea, this involves an extensive number of treatment options, and is a complaint that carries with it a high risk of complications and morbidity.  The differential diagnosis includes gastritis, viral gastroenteritis, foodborne illness, colitis, diverticulitis, pancreatitis, cannabinoid hyperemesis syndrome, other   Co morbidities that complicate the patient evaluation  none   Additional history obtained:  Additional history obtained from EMR External records from outside source obtained and reviewed including prior notes and labs   Lab Tests:  I Ordered, and personally interpreted labs.  The pertinent results include: CBC-white blood cell count is 11 which is down from visit 2 days ago, hemoglobin slightly elevated at 15.6, lipase is normal, CMP with slightly low potassium at 3.3, CO2 is 20 likely due to GI losses, bilirubin is 2.1       Problem List / ED Course / Critical interventions / Medication management  Nausea vomiting diarrhea-ongoing for 4 days.  This  happened after reportedly eating slightly undercooked fish.  Patient is nontoxic in appearance with minimal diffuse tenderness on abdominal exam.  Do not feel she needs further imaging as she had normal CT scan 2 days ago.  She states that Zofran  at home has not been helping but her mother gave her a Phenergan at home and this has improved her symptoms with Zofran , but they do not have any more.  Patient was given a dose of droperidol here with significant relief, currently she is sleeping, awaiting urinalysis as initial UA was sent for culture and recommended recollection, UA 2 days ago also contaminated, will recollect urine today.  Will beneficial to see if there significant ketones in the urine as well.  I recommended follow-up with GI if symptoms do not resolve in the next couple of days with supportive care.  We discussed bland diet, drinking plenty of fluids.   If UA is negative will discharge with Phenergan.  She already has Pepcid at home.  If UA positive will have same plan but will treat UTI. UA does show ketones, she does have red blood cells and white blood cells but no bacteria, she is not having dysuria, so will send culture as previously recommended recollection I ordered medication including droperidol  for n/v  Reevaluation of the patient after these medicines showed that the patient improved I have reviewed the patients home medicines and have made adjustments as needed    Test / Admission - Considered:  Considered need for admission, consideration of need for imaging    Amount and/or Complexity of Data Reviewed Labs: ordered.  Risk Prescription drug management.        Final diagnoses:  Nausea vomiting and diarrhea    ED Discharge Orders          Ordered    promethazine (PHENERGAN) 12.5 MG tablet  Every 6 hours PRN        07/17/23 1847               Aimee Houseman, PA-C 07/17/23 1856    Aimee Houseman, PA-C 07/17/23 1940

## 2023-07-19 LAB — URINE CULTURE

## 2023-07-21 ENCOUNTER — Other Ambulatory Visit: Payer: Self-pay

## 2023-07-21 ENCOUNTER — Encounter (HOSPITAL_COMMUNITY): Payer: Self-pay

## 2023-07-21 ENCOUNTER — Emergency Department (HOSPITAL_COMMUNITY)
Admission: EM | Admit: 2023-07-21 | Discharge: 2023-07-21 | Disposition: A | Discharge status: Home / Self Care | Attending: Emergency Medicine | Admitting: Emergency Medicine

## 2023-07-21 ENCOUNTER — Emergency Department (HOSPITAL_COMMUNITY)

## 2023-07-21 DIAGNOSIS — R112 Nausea with vomiting, unspecified: Secondary | ICD-10-CM | POA: Diagnosis not present

## 2023-07-21 DIAGNOSIS — J45909 Unspecified asthma, uncomplicated: Secondary | ICD-10-CM | POA: Diagnosis not present

## 2023-07-21 DIAGNOSIS — K7689 Other specified diseases of liver: Secondary | ICD-10-CM | POA: Diagnosis not present

## 2023-07-21 DIAGNOSIS — Z7951 Long term (current) use of inhaled steroids: Secondary | ICD-10-CM | POA: Diagnosis not present

## 2023-07-21 DIAGNOSIS — E876 Hypokalemia: Secondary | ICD-10-CM | POA: Insufficient documentation

## 2023-07-21 DIAGNOSIS — R197 Diarrhea, unspecified: Secondary | ICD-10-CM | POA: Diagnosis not present

## 2023-07-21 DIAGNOSIS — R1084 Generalized abdominal pain: Secondary | ICD-10-CM | POA: Diagnosis not present

## 2023-07-21 DIAGNOSIS — R109 Unspecified abdominal pain: Secondary | ICD-10-CM | POA: Diagnosis not present

## 2023-07-21 DIAGNOSIS — N281 Cyst of kidney, acquired: Secondary | ICD-10-CM | POA: Diagnosis not present

## 2023-07-21 LAB — COMPREHENSIVE METABOLIC PANEL WITH GFR
ALT: 11 U/L (ref 0–44)
AST: 20 U/L (ref 15–41)
Albumin: 4.1 g/dL (ref 3.5–5.0)
Alkaline Phosphatase: 118 U/L (ref 38–126)
Anion gap: 12 (ref 5–15)
BUN: 9 mg/dL (ref 6–20)
CO2: 24 mmol/L (ref 22–32)
Calcium: 9 mg/dL (ref 8.9–10.3)
Chloride: 98 mmol/L (ref 98–111)
Creatinine, Ser: 0.74 mg/dL (ref 0.44–1.00)
GFR, Estimated: >60 mL/min (ref >60)
Glucose, Bld: 94 mg/dL (ref 70–99)
Potassium: 3.1 mmol/L — ABNORMAL LOW (ref 3.5–5.1)
Sodium: 134 mmol/L — ABNORMAL LOW (ref 135–145)
Total Bilirubin: 1.4 mg/dL — ABNORMAL HIGH (ref 0.0–1.2)
Total Protein: 7.8 g/dL (ref 6.5–8.1)

## 2023-07-21 LAB — CBC WITH DIFFERENTIAL/PLATELET
Abs Immature Granulocytes: 0.04 10*3/uL (ref 0.00–0.07)
Basophils Absolute: 0 10*3/uL (ref 0.0–0.1)
Basophils Relative: 0 %
Eosinophils Absolute: 0 10*3/uL (ref 0.0–0.5)
Eosinophils Relative: 0 %
HCT: 42.4 % (ref 36.0–46.0)
Hemoglobin: 14.6 g/dL (ref 12.0–15.0)
Immature Granulocytes: 0 %
Lymphocytes Relative: 17 %
Lymphs Abs: 1.6 10*3/uL (ref 0.7–4.0)
MCH: 30 pg (ref 26.0–34.0)
MCHC: 34.4 g/dL (ref 30.0–36.0)
MCV: 87.1 fL (ref 80.0–100.0)
Monocytes Absolute: 0.8 10*3/uL (ref 0.1–1.0)
Monocytes Relative: 9 %
Neutro Abs: 6.7 10*3/uL (ref 1.7–7.7)
Neutrophils Relative %: 74 %
Platelets: 330 10*3/uL (ref 150–400)
RBC: 4.87 MIL/uL (ref 3.87–5.11)
RDW: 11.8 % (ref 11.5–15.5)
WBC: 9.1 10*3/uL (ref 4.0–10.5)
nRBC: 0 % (ref 0.0–0.2)

## 2023-07-21 LAB — URINALYSIS, ROUTINE W REFLEX MICROSCOPIC
Bilirubin Urine: NEGATIVE
Glucose, UA: NEGATIVE mg/dL
Ketones, ur: 20 mg/dL — AB
Nitrite: NEGATIVE
Protein, ur: 100 mg/dL — AB
Specific Gravity, Urine: 1.021 (ref 1.005–1.030)
pH: 6 (ref 5.0–8.0)

## 2023-07-21 LAB — RAPID URINE DRUG SCREEN, HOSP PERFORMED
Amphetamines: NOT DETECTED
Barbiturates: NOT DETECTED
Benzodiazepines: NOT DETECTED
Cocaine: NOT DETECTED
Opiates: POSITIVE — AB
Tetrahydrocannabinol: POSITIVE — AB

## 2023-07-21 LAB — LIPASE, BLOOD: Lipase: 30 U/L (ref 11–51)

## 2023-07-21 LAB — POC URINE PREG, ED: Preg Test, Ur: NEGATIVE — AB

## 2023-07-21 LAB — MAGNESIUM: Magnesium: 2.1 mg/dL (ref 1.7–2.4)

## 2023-07-21 MED ORDER — POTASSIUM CHLORIDE CRYS ER 20 MEQ PO TBCR
40.0000 meq | EXTENDED_RELEASE_TABLET | Freq: Once | ORAL | Status: AC
  Administered 2023-07-21: 40 meq via ORAL
  Filled 2023-07-21: qty 2

## 2023-07-21 MED ORDER — LACTATED RINGERS IV BOLUS
1000.0000 mL | Freq: Once | INTRAVENOUS | Status: AC
  Administered 2023-07-21: 1000 mL via INTRAVENOUS

## 2023-07-21 MED ORDER — PROMETHAZINE HCL 25 MG RE SUPP: 25.0000 mg | Freq: Four times a day (QID) | RECTAL | 0 refills | Status: AC | PRN

## 2023-07-21 MED ORDER — ONDANSETRON HCL 4 MG/2ML IJ SOLN
4.0000 mg | Freq: Once | INTRAMUSCULAR | Status: AC
  Administered 2023-07-21: 4 mg via INTRAVENOUS
  Filled 2023-07-21: qty 2

## 2023-07-21 MED ORDER — IOHEXOL 300 MG/ML  SOLN
100.0000 mL | Freq: Once | INTRAMUSCULAR | Status: AC | PRN
  Administered 2023-07-21: 100 mL via INTRAVENOUS

## 2023-07-21 MED ORDER — PROMETHAZINE HCL 25 MG/ML IJ SOLN
INTRAMUSCULAR | Status: AC
  Filled 2023-07-21: qty 1

## 2023-07-21 MED ORDER — SODIUM CHLORIDE 0.9 % IV SOLN
12.5000 mg | Freq: Once | INTRAVENOUS | Status: AC
  Administered 2023-07-21: 12.5 mg via INTRAVENOUS
  Filled 2023-07-21: qty 0.5

## 2023-07-21 MED ORDER — DROPERIDOL 2.5 MG/ML IJ SOLN
0.6250 mg | Freq: Once | INTRAMUSCULAR | Status: AC
  Administered 2023-07-21: 0.625 mg via INTRAVENOUS
  Filled 2023-07-21: qty 2

## 2023-07-21 MED ORDER — MORPHINE SULFATE (PF) 4 MG/ML IV SOLN
4.0000 mg | Freq: Once | INTRAVENOUS | Status: AC
  Administered 2023-07-21: 4 mg via INTRAVENOUS
  Filled 2023-07-21: qty 1

## 2023-07-21 NOTE — Discharge Instructions (Signed)
 Continue to drink plenty of fluids at home to maintain good hydration.  Continue to abstain from use of THC.  Take your home nausea medications as needed.  If you are unable to keep medication down, there was a prescription for Phenergan  suppositories sent to your pharmacy.  If you have uncontrolled symptoms, please return to the emergency department.

## 2023-07-21 NOTE — ED Notes (Signed)
 AC aware that we need phenergan .

## 2023-07-21 NOTE — ED Triage Notes (Signed)
 Pt arrived c/o recurrent N/V and chest pain on-going now for the past week. Pt reports prescribed medications have not helped. Pt presents diaphoretic in Triage.

## 2023-07-21 NOTE — ED Notes (Signed)
 Patient transported to CT

## 2023-07-21 NOTE — ED Provider Notes (Signed)
 [place] EMERGENCY DEPARTMENT AT [facility] Provider Note   CSN: [id number] Arrival date & time: 07/21/23  1656     Patient presents with: Abdominal Pain   Allison Key is a 23 y.o. female.    Abdominal Pain Associated symptoms: diarrhea, nausea and vomiting   Patient presents for abdominal pain, nausea, vomiting.  Medical history includes asthma, depression.  This is her fourth visit in the past week for nausea and vomiting.  She feels that symptoms may have started after eating some undercooked fish.  On her first visit, she was treated with GI cocktail, Compazine , Benadryl , Zofran , fluids.  The following day, she returned to the ED and was treated with Reglan , fluids, morphine .  2 days later, she returned to the ED and was treated with droperidol , fluids, Zofran .  She has been prescribed Phenergan  and Zofran  for home.  Patient states that she has not been able to eat or drink anything in the past 8 days.  When asked about p.o. challenges in the ED, patient denies this.  She has not been able to tolerate her antiemetics at home.  Prior to arrival, she had a syncopal episode at work.  She describes a prodrome of dizziness and lightheadedness.  Currently, she describes a central abdominal pain and ongoing nausea.  She has continued to have diarrhea, estimated 3 episodes per day of watery diarrhea.     Prior to Admission medications   Medication Sig Start Date End Date Taking? Authorizing Provider  promethazine  (PHENERGAN ) 25 MG suppository Place 1 suppository (25 mg total) rectally every 6 (six) hours as needed for nausea or vomiting. 07/21/23  Yes Melvenia Motto, MD  albuterol  (VENTOLIN  HFA) 108 (90 Base) MCG/ACT inhaler Inhale 2 puffs into the lungs every 4 (four) hours as needed for wheezing or shortness of breath. 10/31/21   Haze Lonni PARAS, MD  aspirin-acetaminophen -caffeine (EXCEDRIN MIGRAINE) 250-250-65 MG tablet Take 2 tablets by mouth every 6 (six) hours as needed  for headache.    [provider]  clindamycin  (CLEOCIN -T) 1 % gel Apply topically 2 (two) times daily. 04/16/23   Delores Nidia LITTIE, FNP  clindamycin -benzoyl peroxide (BENZACLIN WITH PUMP) gel Dispense generic brand. Apply to acne on face twice a day after washing with acne soap 03/06/17   Theotis Allena HERO, MD  DIFFERIN  0.1 % cream Dispense Brand Name. Apply to acne at night after washing face with acne soap 07/21/16   Theotis Allena HERO, MD  famotidine  (PEPCID ) 20 MG tablet Take 1 tablet (20 mg total) by mouth 2 (two) times daily. 07/14/23   Kommor, Madison, MD  fluticasone  (FLONASE ) 50 MCG/ACT nasal spray INHALE 2 SPRAYS IN EACH NOSTRIL ONCE DAILY. 11/20/16   Theotis Allena HERO, MD  medroxyPROGESTERone  (PROVERA ) 10 MG tablet Take 1 tablet (10 mg total) by mouth daily. Use for ten days 04/16/23   Delores Nidia LITTIE, FNP  ondansetron  (ZOFRAN -ODT) 4 MG disintegrating tablet Take 1 tablet (4 mg total) by mouth every 8 (eight) hours as needed for nausea or vomiting. 07/14/23   Kommor, Lum, MD  promethazine  (PHENERGAN ) 12.5 MG tablet Take 2 tablets (25 mg total) by mouth every 6 (six) hours as needed for nausea or vomiting. 07/17/23   Suellen Cantor A, PA-C    Allergies: Patient has no known allergies.    Review of Systems  Gastrointestinal:  Positive for abdominal pain, diarrhea, nausea and vomiting.  Neurological:  Positive for syncope.  All other systems reviewed and are negative.   Updated  Vital Signs BP (!) 98/59 (BP Location: Left Arm)   Pulse 74   Temp 98.2 F (36.8 C) (Oral)   Resp 17   Ht 5' 1 (1.549 m)   Wt 99.7 kg   LMP 07/21/2023 (Exact Date)   SpO2 95%   BMI 41.53 kg/m   Physical Exam Vitals and nursing note reviewed.  Constitutional:      General: She is not in acute distress.    Appearance: She is well-developed. She is not ill-appearing, toxic-appearing or diaphoretic.  HENT:     Head: Normocephalic and atraumatic.     Mouth/Throat:     Mouth: Mucous  membranes are moist.   Eyes:     Conjunctiva/sclera: Conjunctivae normal.    Cardiovascular:     Rate and Rhythm: Normal rate and regular rhythm.  Pulmonary:     Effort: Pulmonary effort is normal. No respiratory distress.  Abdominal:     General: Abdomen is flat.     Palpations: Abdomen is soft.     Tenderness: There is generalized abdominal tenderness. There is no guarding or rebound.   Musculoskeletal:        General: No swelling.     Cervical back: Neck supple.   Skin:    General: Skin is warm and dry.     Coloration: Skin is not cyanotic or jaundiced.   Neurological:     General: No focal deficit present.     Mental Status: She is alert and oriented to person, place, and time.   Psychiatric:        Mood and Affect: Mood normal.        Behavior: Behavior normal.     (all labs ordered are listed, but only abnormal results are displayed) Labs Reviewed  COMPREHENSIVE METABOLIC PANEL WITH GFR - Abnormal; Notable for the following components:      Result Value   Sodium 134 (*)    Potassium 3.1 (*)    Total Bilirubin 1.4 (*)    All other components within normal limits  URINALYSIS, ROUTINE W REFLEX MICROSCOPIC - Abnormal; Notable for the following components:   Color, Urine AMBER (*)    APPearance HAZY (*)    Hgb urine dipstick LARGE (*)    Ketones, ur 20 (*)    Protein, ur 100 (*)    Leukocytes,Ua SMALL (*)    Bacteria, UA FEW (*)    All other components within normal limits  RAPID URINE DRUG SCREEN, HOSP PERFORMED - Abnormal; Notable for the following components:   Opiates POSITIVE (*)    Tetrahydrocannabinol POSITIVE (*)    All other components within normal limits  POC URINE PREG, ED - Abnormal; Notable for the following components:   Preg Test, Ur Negative (*)    All other components within normal limits  C DIFFICILE QUICK SCREEN W PCR REFLEX    GASTROINTESTINAL PANEL BY PCR, STOOL (REPLACES STOOL CULTURE)  LIPASE, BLOOD  CBC WITH DIFFERENTIAL/PLATELET   MAGNESIUM     EKG: EKG Interpretation Date/Time:  Tuesday July 21 2023 17:11:59 EDT Ventricular Rate:  84 PR Interval:  138 QRS Duration:  88 QT Interval:  360 QTC Calculation: 426 R Axis:   43  Text Interpretation: Sinus arrhythmia Borderline Q waves in inferior leads Confirmed by Melvenia Motto [phone]) on 07/21/2023 5:54:01 PM  Radiology: CT ABDOMEN PELVIS W CONTRAST Result Date: 07/21/2023 CLINICAL DATA:  Acute abdominal pain.  Nausea and vomiting. EXAM: CT ABDOMEN AND PELVIS WITH CONTRAST TECHNIQUE: Multidetector CT imaging of the  abdomen and pelvis was performed using the standard protocol following bolus administration of intravenous contrast. RADIATION DOSE REDUCTION: This exam was performed according to the departmental dose-optimization program which includes automated exposure control, adjustment of the mA and/or kV according to patient size and/or use of iterative reconstruction technique. CONTRAST:  OMNIPAQUE  IOHEXOL  300 MG/ML  SOLN COMPARISON:  CT abdomen and pelvis 02/14/2023. FINDINGS: Lower chest: No acute abnormality. Hepatobiliary: There is an exophytic cyst from the inferior margin of the right lobe of the liver measuring 2.1 cm, unchanged. The liver is otherwise within normal limits. No gallstones, gallbladder wall thickening, or biliary dilatation. Pancreas: Unremarkable. No pancreatic ductal dilatation or surrounding inflammatory changes. Spleen: Normal in size without focal abnormality. Adrenals/Urinary Tract: There is a subcentimeter cyst in the posterior right kidney. Otherwise, the adrenal glands, kidneys and bladder are within normal limits. Stomach/Bowel: Stomach is within normal limits. Appendix appears normal. No evidence of bowel wall thickening, distention, or inflammatory changes. Vascular/Lymphatic: No significant vascular findings are present. No enlarged abdominal or pelvic lymph nodes. Reproductive: Uterus and bilateral adnexa are unremarkable. Other: No  abdominal wall hernia or abnormality. No abdominopelvic ascites. Musculoskeletal: No fracture is seen. IMPRESSION: 1. No acute localizing process in the abdomen or pelvis. 2. Stable hepatic and right renal cysts. No follow-up imaging recommended. Electronically Signed   By: Greig Pique M.D.   On: 07/21/2023 20:49     Procedures   Medications Ordered in the ED  promethazine  (PHENERGAN ) 12.5 mg in sodium chloride  0.9 % 50 mL IVPB (0 mg Intravenous Stopped 07/21/23 1953)  lactated ringers  bolus 1,000 mL (0 mLs Intravenous Stopped 07/21/23 2006)  ondansetron  (ZOFRAN ) injection 4 mg (4 mg Intravenous Given 07/21/23 1840)  morphine  (PF) 4 MG/ML injection 4 mg (4 mg Intravenous Given 07/21/23 1840)  potassium chloride  SA (KLOR-CON  M) CR tablet 40 mEq (40 mEq Oral Given 07/21/23 1927)  iohexol  (OMNIPAQUE ) 300 MG/ML solution 100 mL (100 mLs Intravenous Contrast Given 07/21/23 2035)  droperidol  (INAPSINE ) 2.5 MG/ML injection 0.625 mg (0.625 mg Intravenous Given 07/21/23 2129)                                    Medical Decision Making Amount and/or Complexity of Data Reviewed Labs: ordered. Radiology: ordered.  Risk Prescription drug management.   This patient presents to the ED for concern of abdominal pain, nausea, vomiting, this involves an extensive number of treatment options, and is a complaint that carries with it a high risk of complications and morbidity.  The differential diagnosis includes cyclic vomiting syndrome, cannabinoid hyperemesis syndrome, enteritis, GERD, dehydration, metabolic derangements   Co morbidities / Chronic conditions that complicate the patient evaluation  asthma, depression   Additional history obtained:  Additional history obtained from EMR External records from outside source obtained and reviewed including patient's mother   Lab Tests:  I Ordered, and personally interpreted labs.  The pertinent results include: Normal kidney function, hypokalemia with  otherwise normal electrolytes, no leukocytosis.  Urinalysis with hematuria consistent with current menstrual cycle.  UDS positive for THC.   Imaging Studies ordered:  I ordered imaging studies including CT of abdomen and pelvis I independently visualized and interpreted imaging which showed no acute findings I agree with the radiologist interpretation   Cardiac Monitoring: / EKG:  The patient was maintained on a cardiac monitor.  I personally viewed and interpreted the cardiac monitored which showed an underlying rhythm  of: Sinus rhythm   Problem List / ED Course / Critical interventions / Medication management  Patient presenting for abdominal pain, nausea, vomiting, diarrhea.  On arrival in the ED, vital signs are normal.  She is overall well-appearing on exam.  Abdomen had some generalized tenderness but is soft and without guarding.  During exam, patient had recurrence of vomiting.  Phenergan , Zofran , and IV fluids were ordered.  Workup was initiated.  Lab work was notable for hypokalemia.  Replacement potassium was ordered.  Patient's UDS was positive for THC.  Dose of droperidol  was ordered for ongoing symptomatic relief.  On reassessment, patient is now pain and nausea free.  I discussed THC use with her and how it can cause the symptoms.  Patient states that her last use was 8 days ago other than 1 puff last night.  Patient would benefit from continuing to abstain from [facility].  She has oral Zofran  and Phenergan  at home.  She was prescribed suppositories to take as needed.  She was encouraged to continue to drink plenty fluids to stay hydrated.  She was discharged in stable condition. I ordered medication including Zofran , Phenergan  for nausea; morphine  and droperidol  for abdominal pain; potassium chloride  for hypokalemia; IV fluids for hydration Reevaluation of the patient after these medicines showed that the patient improved I have reviewed the patients home medicines and have made  adjustments as needed  Social Determinants of Health:  Lives independently     Final diagnoses:  Generalized abdominal pain  Nausea vomiting and diarrhea  Hypokalemia    ED Discharge Orders          Ordered    promethazine  (PHENERGAN ) 25 MG suppository  Every 6 hours PRN        07/21/23 2258               Melvenia Motto, MD 07/21/23 2259

## 2023-07-21 NOTE — ED Notes (Signed)
 Pt reminded that we need urine and stool sample.

## 2023-07-22 ENCOUNTER — Ambulatory Visit: Admitting: Women's Health

## 2023-07-22 LAB — POC URINE PREG, ED: Preg Test, Ur: NEGATIVE

## 2023-09-18 ENCOUNTER — Encounter: Payer: Self-pay | Admitting: Radiology

## 2023-09-22 DIAGNOSIS — G43009 Migraine without aura, not intractable, without status migrainosus: Secondary | ICD-10-CM | POA: Diagnosis not present

## 2023-09-22 DIAGNOSIS — R030 Elevated blood-pressure reading, without diagnosis of hypertension: Secondary | ICD-10-CM | POA: Diagnosis not present

## 2023-10-16 ENCOUNTER — Encounter: Payer: Self-pay | Admitting: *Deleted

## 2023-10-19 ENCOUNTER — Ambulatory Visit

## 2023-11-30 ENCOUNTER — Encounter: Payer: Self-pay | Admitting: Radiology
